# Patient Record
Sex: Female | Born: 1965 | Race: Black or African American | Hispanic: No | Marital: Married | Smoking: Never smoker
Health system: Southern US, Community
[De-identification: ages and names within clinical notes are randomized; demographics above are authoritative.]

## PROBLEM LIST (undated history)

## (undated) HISTORY — PX: REDUCTION MAMMAPLASTY: SUR839

## (undated) HISTORY — PX: ABDOMINAL HYSTERECTOMY: SHX81

---

## 2010-03-13 ENCOUNTER — Ambulatory Visit: Payer: Self-pay | Admitting: Unknown Physician Specialty

## 2010-03-17 ENCOUNTER — Ambulatory Visit: Payer: Self-pay | Admitting: Internal Medicine

## 2010-03-31 ENCOUNTER — Ambulatory Visit: Payer: Self-pay | Admitting: Internal Medicine

## 2010-04-16 ENCOUNTER — Ambulatory Visit: Payer: Self-pay | Admitting: Internal Medicine

## 2010-05-17 ENCOUNTER — Ambulatory Visit: Payer: Self-pay | Admitting: Internal Medicine

## 2010-05-18 ENCOUNTER — Ambulatory Visit: Payer: Self-pay | Admitting: Unknown Physician Specialty

## 2010-06-09 ENCOUNTER — Ambulatory Visit: Payer: Self-pay | Admitting: Unknown Physician Specialty

## 2010-06-16 ENCOUNTER — Ambulatory Visit: Payer: Self-pay | Admitting: Internal Medicine

## 2010-07-17 ENCOUNTER — Ambulatory Visit: Payer: Self-pay | Admitting: Internal Medicine

## 2010-08-17 ENCOUNTER — Ambulatory Visit: Payer: Self-pay | Admitting: Internal Medicine

## 2011-02-06 ENCOUNTER — Emergency Department: Payer: Self-pay | Admitting: Emergency Medicine

## 2011-03-21 ENCOUNTER — Emergency Department: Payer: Self-pay | Admitting: Emergency Medicine

## 2013-07-13 ENCOUNTER — Emergency Department: Payer: Self-pay | Admitting: Emergency Medicine

## 2013-07-13 LAB — COMPREHENSIVE METABOLIC PANEL
Albumin: 3.3 g/dL — ABNORMAL LOW (ref 3.4–5.0)
Alkaline Phosphatase: 91 U/L (ref 50–136)
Anion Gap: 4 — ABNORMAL LOW (ref 7–16)
BUN: 9 mg/dL (ref 7–18)
Chloride: 107 mmol/L (ref 98–107)
Co2: 28 mmol/L (ref 21–32)
Creatinine: 0.96 mg/dL (ref 0.60–1.30)
EGFR (African American): >60
Glucose: 113 mg/dL — ABNORMAL HIGH (ref 65–99)
Osmolality: 277 (ref 275–301)
Potassium: 3.5 mmol/L (ref 3.5–5.1)
SGOT(AST): 17 U/L (ref 15–37)
Sodium: 139 mmol/L (ref 136–145)

## 2013-07-13 LAB — URINALYSIS, COMPLETE
Bilirubin,UR: NEGATIVE
Blood: NEGATIVE
Glucose,UR: NEGATIVE mg/dL (ref 0–75)
Ph: 6 (ref 4.5–8.0)
Protein: NEGATIVE
Specific Gravity: 1.018 (ref 1.003–1.030)

## 2013-07-13 LAB — CBC
HCT: 38.2 % (ref 35.0–47.0)
HGB: 12.5 g/dL (ref 12.0–16.0)
MCHC: 32.7 g/dL (ref 32.0–36.0)
MCV: 83 fL (ref 80–100)
Platelet: 282 10*3/uL (ref 150–440)

## 2013-07-13 LAB — CK TOTAL AND CKMB (NOT AT ARMC)
CK, Total: 85 U/L (ref 21–215)
CK-MB: 0.8 ng/mL (ref 0.5–3.6)

## 2013-07-13 LAB — TROPONIN I: Troponin-I: <0.02 ng/mL

## 2015-05-23 ENCOUNTER — Encounter: Payer: Self-pay | Admitting: Anesthesiology

## 2015-05-23 ENCOUNTER — Ambulatory Visit: Payer: Managed Care, Other (non HMO) | Admitting: Anesthesiology

## 2015-05-23 ENCOUNTER — Ambulatory Visit
Admission: RE | Admit: 2015-05-23 | Discharge: 2015-05-23 | Disposition: A | Discharge status: Home / Self Care | Payer: Managed Care, Other (non HMO) | Source: Ambulatory Visit | Attending: Unknown Physician Specialty | Admitting: Unknown Physician Specialty

## 2015-05-23 ENCOUNTER — Encounter
Admission: RE | Disposition: A | Discharge status: Home / Self Care | Payer: Self-pay | Source: Ambulatory Visit | Attending: Unknown Physician Specialty

## 2015-05-23 DIAGNOSIS — K621 Rectal polyp: Secondary | ICD-10-CM | POA: Diagnosis not present

## 2015-05-23 DIAGNOSIS — K648 Other hemorrhoids: Secondary | ICD-10-CM | POA: Diagnosis not present

## 2015-05-23 DIAGNOSIS — Z1211 Encounter for screening for malignant neoplasm of colon: Secondary | ICD-10-CM | POA: Diagnosis not present

## 2015-05-23 DIAGNOSIS — Z79899 Other long term (current) drug therapy: Secondary | ICD-10-CM | POA: Diagnosis not present

## 2015-05-23 HISTORY — PX: COLONOSCOPY: SHX5424

## 2015-05-23 SURGERY — COLONOSCOPY
Anesthesia: General

## 2015-05-23 MED ORDER — FENTANYL CITRATE (PF) 100 MCG/2ML IJ SOLN
INTRAMUSCULAR | Status: DC | PRN
  Administered 2015-05-23: 50 ug via INTRAVENOUS

## 2015-05-23 MED ORDER — MIDAZOLAM HCL 2 MG/2ML IJ SOLN
INTRAMUSCULAR | Status: DC | PRN
  Administered 2015-05-23: 1 mg via INTRAVENOUS

## 2015-05-23 MED ORDER — SODIUM CHLORIDE 0.9 % IV SOLN: INTRAVENOUS | Status: DC

## 2015-05-23 MED ORDER — LACTATED RINGERS IV SOLN
INTRAVENOUS | Status: DC
  Administered 2015-05-23: 1000 mL via INTRAVENOUS

## 2015-05-23 NOTE — Anesthesia Preprocedure Evaluation (Signed)
Anesthesia Evaluation  Patient identified by MRN, date of birth, ID band Patient awake    Reviewed: Allergy & Precautions, NPO status , Patient's Chart, lab work & pertinent test results  Airway Mallampati: II  TM Distance: >3 FB     Dental  (+) Chipped, Partial Lower   Pulmonary          Cardiovascular     Neuro/Psych    GI/Hepatic   Endo/Other    Renal/GU      Musculoskeletal   Abdominal   Peds  Hematology   Anesthesia Other Findings   Reproductive/Obstetrics                             Anesthesia Physical Anesthesia Plan  ASA: II  Anesthesia Plan: General   Post-op Pain Management:    Induction: Intravenous  Airway Management Planned: Nasal Cannula  Additional Equipment:   Intra-op Plan:   Post-operative Plan:   Informed Consent: I have reviewed the patients History and Physical, chart, labs and discussed the procedure including the risks, benefits and alternatives for the proposed anesthesia with the patient or authorized representative who has indicated his/her understanding and acceptance.     Plan Discussed with:   Anesthesia Plan Comments:         Anesthesia Quick Evaluation

## 2015-05-23 NOTE — Anesthesia Postprocedure Evaluation (Signed)
  Anesthesia Post-op Note  Patient: Jennifer Pace  Procedure(s) Performed: Procedure(s): COLONOSCOPY (N/A)  Anesthesia type:General  Patient location: PACU  Post pain: Pain level controlled  Post assessment: Post-op Vital signs reviewed, Patient's Cardiovascular Status Stable, Respiratory Function Stable, Patent Airway and No signs of Nausea or vomiting  Post vital signs: Reviewed and stable  Last Vitals:  Filed Vitals:   05/23/15 1301  BP: 133/95  Pulse: 78  Temp:   Resp: 12    Level of consciousness: awake, alert  and patient cooperative  Complications: No apparent anesthesia complications

## 2015-05-23 NOTE — Op Note (Signed)
[facility] Gastroenterology Patient Name: Jennifer Pace Procedure Date: 05/23/2015 11:17 AM MRN: [id number] Account #: [id number] Date of Birth: 1966-11-10 Admit Type: Outpatient Age: [age] Room: [facility] ENDO ROOM 1 Gender: Female Note Status: Finalized Procedure:         Colonoscopy Indications:       Screening for colorectal malignant neoplasm Providers:         Scot Junobert T. Elliott, MD Medicines:         Propofol per Anesthesia Complications:     No immediate complications. Procedure:         Pre-Anesthesia Assessment:                    - After reviewing the risks and benefits, the patient was                     deemed in satisfactory condition to undergo the procedure.                    After obtaining informed consent, the colonoscope was                     passed under direct vision. Throughout the procedure, the                     patient's blood pressure, pulse, and oxygen saturations                     were monitored continuously. The Olympus PCF-H180AL                     colonoscope ( S#: [id number] ) was introduced through the                     anus and advanced to the the cecum, identified by                     appendiceal orifice and ileocecal valve. The colonoscopy                     was performed without difficulty. The patient tolerated                     the procedure well. The quality of the bowel preparation                     was excellent. Findings:      A diminutive polyp was found in the rectum. The polyp was sessile. The       polyp was removed with a cold biopsy forceps. Resection and retrieval       were complete.      Internal hemorrhoids were found during endoscopy. The hemorrhoids were       medium-sized and Grade I (internal hemorrhoids that do not prolapse).      The exam was otherwise without abnormality. Impression:        - One diminutive polyp in the rectum. Resected and                     retrieved.                    -  Internal hemorrhoids.                    - The examination was  otherwise normal. Recommendation:    - Await pathology results. Scot Jun, MD 05/23/2015 16:10:96 PM This report has been signed electronically. Number of Addenda: 0 Note Initiated On: 05/23/2015 11:17 AM Scope Withdrawal Time: 0 hours 10 minutes 3 seconds  Total Procedure Duration: 0 hours 17 minutes 57 seconds       [facility]

## 2015-05-23 NOTE — Transfer of Care (Signed)
Immediate Anesthesia Transfer of Care Note  Patient: Jennifer Pace  Procedure(s) Performed: Procedure(s): COLONOSCOPY (N/A)  Patient Location: PACU  Anesthesia Type:General  Level of Consciousness: awake  Airway & Oxygen Therapy: Patient Spontanous Breathing  Post-op Assessment: Report given to RN  Post vital signs: stable  Last Vitals:  Filed Vitals:   05/23/15 1230  BP: 115/53  Pulse: 93  Temp: 36.7 C  Resp: 16    Complications: No apparent anesthesia complications

## 2015-05-23 NOTE — H&P (Signed)
   Primary Care Physician:  PROVIDER NOT IN SYSTEM Primary Gastroenterologist:  Dr. Mechele CollinElliott  Pre-Procedure History & Physical: HPI:  Jennifer Pace is a 49 y.o. female is here for an colonoscopy.   History reviewed. No pertinent past medical history.  History reviewed. No pertinent past surgical history.  Prior to Admission medications   Not on File    Allergies as of 04/19/2015  . (Not on File)    History reviewed. No pertinent family history.  History   Social History  . Marital Status: Married    Spouse Name: N/A  . Number of Children: N/A  . Years of Education: N/A   Occupational History  . Not on file.   Social History Main Topics  . Smoking status: Not on file  . Smokeless tobacco: Not on file  . Alcohol Use: Not on file  . Drug Use: Not on file  . Sexual Activity: Not on file   Other Topics Concern  . Not on file   Social History Narrative  . No narrative on file    Review of Systems: See HPI, otherwise negative ROS  Physical Exam: BP 139/76 mmHg  Pulse 93  Temp(Src) 97.7 F (36.5 C) (Tympanic)  Resp 16  Ht 5\' 7"  (1.702 m)  Wt 94.802 kg (209 lb)  BMI 32.73 kg/m2  SpO2 100% General:   Alert,  pleasant and cooperative in NAD Head:  Normocephalic and atraumatic. Neck:  Supple; no masses or thyromegaly. Lungs:  Clear throughout to auscultation.    Heart:  Regular rate and rhythm. Abdomen:  Soft, nontender and nondistended. Normal bowel sounds, without guarding, and without rebound.   Neurologic:  Alert and  oriented x4;  grossly normal neurologically.  Impression/Plan: Jennifer Pace is here for an colonoscopy to be performed for screening.  Risks, benefits, limitations, and alternatives regarding  colonoscopy have been reviewed with the patient.  Questions have been answered.  All parties agreeable.   Lynnae PrudeELLIOTT, ROBERT, MD  05/23/2015, 12:00 PM

## 2015-05-24 LAB — SURGICAL PATHOLOGY

## 2015-05-26 ENCOUNTER — Encounter: Payer: Self-pay | Admitting: Unknown Physician Specialty

## 2017-08-02 ENCOUNTER — Ambulatory Visit (INDEPENDENT_AMBULATORY_CARE_PROVIDER_SITE_OTHER): Payer: Managed Care, Other (non HMO) | Admitting: Obstetrics & Gynecology

## 2017-08-02 ENCOUNTER — Encounter: Payer: Self-pay | Admitting: Obstetrics & Gynecology

## 2017-08-02 VITALS — BP 120/80 | HR 58 | Ht 67.0 in | Wt 198.0 lb

## 2017-08-02 DIAGNOSIS — Z1211 Encounter for screening for malignant neoplasm of colon: Secondary | ICD-10-CM | POA: Diagnosis not present

## 2017-08-02 DIAGNOSIS — Z0000 Encounter for general adult medical examination without abnormal findings: Secondary | ICD-10-CM | POA: Diagnosis not present

## 2017-08-02 DIAGNOSIS — R232 Flushing: Secondary | ICD-10-CM

## 2017-08-02 NOTE — Progress Notes (Signed)
  HPI:      Jennifer Pace is a 51 y.o. G1P1001 who LMP was in the past, she presents today for her annual examination.  The patient has no complaints today. The patient is sexually active. Herlast pap: approximate date 2016 and was normal and last mammogram: approximate date 2018 and was normal.  The patient does perform self breast exams.  There is no notable family history of breast or ovarian cancer in her family. The patient is not taking hormone replacement therapy. Patient denies post-menopausal vaginal bleeding.   The patient has regular exercise: yes. The patient denies current symptoms of depression.    GYN Hx: Last Colonoscopy:2 years ago. Normal.  Last DEXA: never ago.    PMHx: History reviewed. No pertinent past medical history. Past Surgical History:  Procedure Laterality Date  . ABDOMINAL HYSTERECTOMY    . COLONOSCOPY N/A 05/23/2015   Procedure: COLONOSCOPY;  Surgeon: Scot Jun, MD;  Location: [facility] ENDOSCOPY;  Service: Endoscopy;  Laterality: N/A;   Family History  Problem Relation Age of Onset  . Kidney failure Mother   . Heart Problems Father    Social History  Substance Use Topics  . Smoking status: Never Smoker  . Smokeless tobacco: Never Used  . Alcohol use No    Current Outpatient Prescriptions:  .  Blood Pressure Monitoring (BLOOD PRESSURE MONITOR/L CUFF) MISC, Cuff given and demonstrated use, Disp: , Rfl:  .  Coenzyme Q10 (COQ10) 100 MG CAPS, , Disp: , Rfl:  .  polyethylene glycol powder (GLYCOLAX/MIRALAX) powder, Take as directed for colonoscopy prep., Disp: , Rfl:  .  Multiple Vitamins-Minerals (MULTIVITAMIN WITH MINERALS) tablet, Take by mouth., Disp: , Rfl:  Allergies: Patient has no known allergies.  ROS  Objective: BP 120/80   Pulse (!) 58   Ht 5\' 7"  (1.702 m)   Wt 198 lb (89.8 kg)   BMI 31.01 kg/m   Filed Weights   08/02/17 1429  Weight: 198 lb (89.8 kg)   Body mass index is 31.01 kg/m. OBGyn Exam  Assessment: Annual  Exam 1. Annual physical exam   2. Screen for colon cancer   3. Hot flashes     Plan:            1.  Cervical Screening-  Pap smear schedule reviewed with patient  2. Breast screening- Exam annually and mammogram scheduled  3. Colonoscopy every 10 years, Hemoccult testing after age 69  4. Labs managed by PCP  5. Counseling for hormonal therapy: none Discussed Hot Flashes and their tx options. Patient with bothersome menopausal vasomotor symptoms. Discussed lifestyle interventions such as wearing light clothing, remaining in cool environments, having fan/air conditioner in the room, avoiding hot beverages etc.  Exercise also shown to be significantly helpful in alleviating hot flashes.  Discussed using hormone therapy and concerns about increased risk of heart disease, cerebrovascular disease, thromboembolic disease,  and breast cancer.  Also discussed other medical options such as Clonidine, SSRI, or Neurontin.   Also discussed alternative therapies such as herbal remedies but cautioned that most of the products contained phytoestrogens (plant estrogens) in unregulated amounts which can have the same effects on the body as the pharmaceutical estrogen preparations.  Patient opted for [facility] therapy for now. She will return in as needed for re-evaluation.    F/U  Return in about 1 year (around 08/02/2018) for Annual.  Annamarie Major, MD, [facility], [facility] 08/02/2017  3:05 PM

## 2017-08-02 NOTE — Patient Instructions (Signed)
Perimenopause Perimenopause is the time when your body begins to move into the menopause (no menstrual period for 12 straight months). It is a natural process. Perimenopause can begin 2-8 years before the menopause and usually lasts for 1 year after the menopause. During this time, your ovaries may or may not produce an egg. The ovaries vary in their production of estrogen and progesterone hormones each month. This can cause irregular menstrual periods, difficulty getting pregnant, vaginal bleeding between periods, and uncomfortable symptoms. What are the causes?  Irregular production of the ovarian hormones, estrogen and progesterone, and not ovulating every month. Other causes include:  Tumor of the pituitary gland in the brain.  Medical disease that affects the ovaries.  Radiation treatment.  Chemotherapy.  Unknown causes.  Heavy smoking and excessive alcohol intake can bring on perimenopause sooner.  What are the signs or symptoms?  Hot flashes.  Night sweats.  Irregular menstrual periods.  Decreased sex drive.  Vaginal dryness.  Headaches.  Mood swings.  Depression.  Memory problems.  Irritability.  Tiredness.  Weight gain.  Trouble getting pregnant.  The beginning of losing bone cells (osteoporosis).  The beginning of hardening of the arteries (atherosclerosis). How is this diagnosed? Your health care provider will make a diagnosis by analyzing your age, menstrual history, and symptoms. He or she will do a physical exam and note any changes in your body, especially your female organs. Female hormone tests may or may not be helpful depending on the amount of female hormones you produce and when you produce them. However, other hormone tests may be helpful to rule out other problems. How is this treated? In some cases, no treatment is needed. The decision on whether treatment is necessary during the perimenopause should be made by you and your health care  provider based on how the symptoms are affecting you and your lifestyle. Various treatments are available, such as:  Treating individual symptoms with a specific medicine for that symptom.  Herbal medicines that can help specific symptoms.  Counseling.  Group therapy.  Follow these instructions at home:  Keep track of your menstrual periods (when they occur, how heavy they are, how long between periods, and how long they last) as well as your symptoms and when they started.  Only take over-the-counter or prescription medicines as directed by your health care provider.  Sleep and rest.  Exercise.  Eat a diet that contains calcium (good for your bones) and soy (acts like the estrogen hormone).  Do not smoke.  Avoid alcoholic beverages.  Take vitamin supplements as recommended by your health care provider. Taking vitamin E may help in certain cases.  Take calcium and vitamin D supplements to help prevent bone loss.  Group therapy is sometimes helpful.  Acupuncture may help in some cases. Contact a health care provider if:  You have questions about any symptoms you are having.  You need a referral to a specialist (gynecologist, psychiatrist, or psychologist). Get help right away if:  You have vaginal bleeding.  Your period lasts longer than 8 days.  Your periods are recurring sooner than 21 days.  You have bleeding after intercourse.  You have severe depression.  You have pain when you urinate.  You have severe headaches.  You have vision problems. This information is not intended to replace advice given to you by your health care provider. Make sure you discuss any questions you have with your health care provider. Document Released: 01/10/2005 Document Revised: 05/10/2016 Document Reviewed: 07/02/2013  Risk analyst Patient Education  2017 [employer].   Black Cohosh, Cimicifuga racemosa oral dosage forms What is this medicine? BLACK COHOSH (blak  KOH hosh) or Cimicifuga racemosa is a dietary supplement. It is promoted to relieve symptoms of menopause, such as hot flashes. The FDA has not approved this supplement for any medical use. This supplement may be used for other purposes; ask your health care provider or pharmacist if you have questions. This medicine may be used for other purposes; ask your health care provider or pharmacist if you have questions. What should I tell my health care provider before I take this medicine? They need to know if you have any of these conditions: -breast cancer -cervical, ovarian or uterine cancer -high blood pressure -infertility -liver disease -menstrual changes or irregular periods -unusual vaginal or uterine bleeding -an unusual or allergic reaction to black cohosh, soybeans, tartrazine dye (yellow dye number 5), other medicines, foods, dyes, or preservatives -pregnant or trying to get pregnant -breast-feeding How should I use this medicine? Take this herb by mouth with a glass of water. Follow the directions on the package labeling, or talk to your health care professional. Do not use for longer than 6 months without the advice of a health care professional. Do not use if you are pregnant or breast-feeding. Talk to your obstetrician-gynecologist or certified nurse-midwife. This herb is not for use in children under the age of 18 years. Overdosage: If you think you have taken too much of this medicine contact a poison control center or emergency room at once. NOTE: This medicine is only for you. Do not share this medicine with others. What if I miss a dose? If you miss a dose, take it as soon as you can. If it is almost time for your next dose, take only that dose. Do not take double or extra doses. What may interact with this medicine? -atorvastatin -cisplatin -fertility treatments This list may not describe all possible interactions. Give your health care provider a list of all the medicines,  herbs, non-prescription drugs, or dietary supplements you use. Also tell them if you smoke, drink alcohol, or use illegal drugs. Some items may interact with your medicine. What should I watch for while using this medicine? Since this herb is derived from a plant, allergic reactions are possible. Stop using this herb if you develop a rash. You may need to see your health care professional, or inform them that this occurred. Report any unusual side effects promptly. If you are taking this herb for menstrual or menopausal symptoms, visit your doctor or health care professional for regular checks on your progress. You should have a complete check-up every 6 months. You will need a regular breast and pelvic exam while on this therapy. Follow the advice of your doctor or health care professional. Women should inform their doctor if they wish to become pregnant or think they might be pregnant. If you have any reason to think you are pregnant, stop taking this herb at once and contact your doctor or health care professional. Herbal or dietary supplements are not regulated like medicines. Rigid quality control standards are not required for dietary supplements. The purity and strength of these products can vary. The safety and effect of this dietary supplement for a certain disease or illness is not well known. This product is not intended to diagnose, treat, cure or prevent any disease. The Food and Drug Administration suggests the following to help consumers protect themselves: -Always read product  labels and follow directions. -Natural does not mean a product is safe for humans to take. -Look for products that include USP after the ingredient name. This means that the manufacturer followed the standards of the [place] Pharmacopoeia. -Supplements made or sold by a nationally known food or drug company are more likely to be made under tight controls. You can write to the company for more information about how the  product was made. What side effects may I notice from receiving this medicine? Side effects that you should report to your doctor or health care professional as soon as possible: -allergic reactions like skin rash, itching or hives, swelling of the face, lips, or tongue -breathing problems -dizziness -palpitations -signs and symptoms of liver injury like dark yellow or brown urine; general ill feeling or flu-like symptoms; light-colored stools; loss of appetite; nausea; right upper belly pain; unusually weak or tired; yellowing of the eyes or skin -unusual vaginal bleeding Side effects that usually do not require medical attention (report to your doctor or health care professional if they continue or are bothersome): -breast tenderness -headache -nausea -upset stomach This list may not describe all possible side effects. Call your doctor for medical advice about side effects. You may report side effects to FDA at 1-800-FDA-1088. Where should I keep my medicine? Keep out of the reach of children. Store at room temperature between 15 and 30 degrees C (59 and 86 degrees C). Throw away any unused herb after the expiration date. NOTE: This sheet is a summary. It may not cover all possible information. If you have questions about this medicine, talk to your doctor, pharmacist, or health care provider.  2018 Elsevier/Gold Standard (2016-06-13 14:35:09)

## 2018-03-21 ENCOUNTER — Ambulatory Visit (INDEPENDENT_AMBULATORY_CARE_PROVIDER_SITE_OTHER): Payer: Managed Care, Other (non HMO) | Admitting: Obstetrics & Gynecology

## 2018-03-21 ENCOUNTER — Encounter: Payer: Self-pay | Admitting: Obstetrics & Gynecology

## 2018-03-21 VITALS — BP 120/80 | HR 79 | Ht 67.0 in | Wt 191.0 lb

## 2018-03-21 DIAGNOSIS — N951 Menopausal and female climacteric states: Secondary | ICD-10-CM | POA: Insufficient documentation

## 2018-03-21 MED ORDER — ESTRADIOL 0.05 MG/24HR TD PTWK: 0.0500 mg | MEDICATED_PATCH | TRANSDERMAL | 11 refills | Status: DC

## 2018-03-21 NOTE — Patient Instructions (Signed)
Estradiol skin patches What is this medicine? ESTRADIOL (es tra DYE ole) skin patches contain an estrogen. It is mostly used as hormone replacement in menopausal women. It helps to treat hot flashes and prevent osteoporosis. It is also used to treat women with low estrogen levels or those who have had their ovaries removed. This medicine may be used for other purposes; ask your health care provider or pharmacist if you have questions. COMMON BRAND NAME(S): Alora, Climara, Esclim, Estraderm, FemPatch, Menostar, Minivelle, Vivelle, Vivelle-Dot What should I tell my health care provider before I take this medicine? They need to know if you have any of these conditions: -abnormal vaginal bleeding -blood vessel disease or blood clots -breast, cervical, endometrial, ovarian, liver, or uterine cancer -dementia -diabetes -gallbladder disease -heart disease or recent heart attack -high blood pressure -high cholesterol -high level of calcium in the blood -hysterectomy -kidney disease -liver disease -migraine headaches -protein C deficiency -protein S deficiency -stroke -systemic lupus erythematosus (SLE) -tobacco smoker -an unusual or allergic reaction to estrogens, other hormones, medicines, foods, dyes, or preservatives -pregnant or trying to get pregnant -breast-feeding How should I use this medicine? This medicine is for external use only. Follow the directions on the prescription label. Tear open the pouch, do not use scissors. Remove the stiff protective liner covering the adhesive. Try not to touch the adhesive. Apply the patch, sticky side to the skin, to an area that is clean, dry and hairless. Avoid injured, irritated, calloused, or scarred areas. Do not apply the skin patches to your breasts or around the waistline. Use a different site each time to prevent skin irritation. Do not cut or trim the patch. Do not stop using except on the advice of your doctor or health care professional.  Do not wear more than one patch at a time unless you are told to do so by your doctor or health care professional. Contact your pediatrician regarding the use of this medicine in children. Special care may be needed. A patient package insert for the product will be given with each prescription and refill. Read this sheet carefully each time. The sheet may change frequently. Overdosage: If you think you have taken too much of this medicine contact a poison control center or emergency room at once. NOTE: This medicine is only for you. Do not share this medicine with others. What if I miss a dose? If you miss a dose, apply it as soon as you can. If it is almost time for your next dose, apply only that dose. Do not apply double or extra doses. What may interact with this medicine? Do not take this medicine with any of the following medications: -aromatase inhibitors like aminoglutethimide, anastrozole, exemestane, letrozole, testolactone This medicine may also interact with the following medications: -carbamazepine -certain antibiotics used to treat infections -certain barbiturates used for inducing sleep or treating seizures -grapefruit juice -medicines for fungus infections like itraconazole and ketoconazole -raloxifene or tamoxifen -rifabutin, rifampin, or rifapentine -ritonavir -St. John's Wort This list may not describe all possible interactions. Give your health care provider a list of all the medicines, herbs, non-prescription drugs, or dietary supplements you use. Also tell them if you smoke, drink alcohol, or use illegal drugs. Some items may interact with your medicine. What should I watch for while using this medicine? Visit your doctor or health care professional for regular checks on your progress. You will need a regular breast and pelvic exam and Pap smear while on this medicine. You should also   discuss the need for regular mammograms with your health care professional, and follow  his or her guidelines for these tests. This medicine can make your body retain fluid, making your fingers, hands, or ankles swell. Your blood pressure can go up. Contact your doctor or health care professional if you feel you are retaining fluid. If you have any reason to think you are pregnant, stop taking this medicine right away and contact your doctor or health care professional. Smoking increases the risk of getting a blood clot or having a stroke while you are taking this medicine, especially if you are more than 54 years old. You are strongly advised not to smoke. If you wear contact lenses and notice visual changes, or if the lenses begin to feel uncomfortable, consult your eye doctor or health care professional. This medicine can increase the risk of developing a condition (endometrial hyperplasia) that may lead to cancer of the lining of the uterus. Taking progestins, another hormone drug, with this medicine lowers the risk of developing this condition. Therefore, if your uterus has not been removed (by a hysterectomy), your doctor may prescribe a progestin for you to take together with your estrogen. You should know, however, that taking estrogens with progestins may have additional health risks. You should discuss the use of estrogens and progestins with your health care professional to determine the benefits and risks for you. If you are going to have surgery or an MRI, you may need to stop taking this medicine. Consult your health care professional for advice before you schedule the surgery. Contact with water while you are swimming, using a sauna, bathing, or showering may cause the patch to fall off. If your patch falls off reapply it. If you cannot reapply the patch, apply a new patch to another area and continue to follow your usual dose schedule. What side effects may I notice from receiving this medicine? Side effects that you should report to your doctor or health care professional as  soon as possible: -allergic reactions like skin rash, itching or hives, swelling of the face, lips, or tongue -breast tissue changes or discharge -changes in vision -chest pain -confusion, trouble speaking or understanding -dark urine -general ill feeling or flu-like symptoms -light-colored stools -nausea, vomiting -pain, swelling, warmth in the leg -right upper belly pain -severe headaches -shortness of breath -sudden numbness or weakness of the face, arm or leg -trouble walking, dizziness, loss of balance or coordination -unusual vaginal bleeding -yellowing of the eyes or skin Side effects that usually do not require medical attention (report to your doctor or health care professional if they continue or are bothersome): -hair loss -increased hunger or thirst -increased urination -symptoms of vaginal infection like itching, irritation or unusual discharge -unusually weak or tired This list may not describe all possible side effects. Call your doctor for medical advice about side effects. You may report side effects to FDA at 1-800-FDA-1088. Where should I keep my medicine? Keep out of the reach of children. Store at room temperature below 30 degrees C (86 degrees F). Do not store any patches that have been removed from their protective pouch. Throw away any unused medicine after the expiration date. Dispose of used patches properly. Since used patches may still contain active hormones, fold the patch in half so that it sticks to itself prior to disposal. NOTE: This sheet is a summary. It may not cover all possible information. If you have questions about this medicine, talk to your doctor, pharmacist, or   health care provider.  2018 Elsevier/Gold Standard (2016-06-26 12:58:11)  

## 2018-03-21 NOTE — Progress Notes (Signed)
HPI:      [name] is a 52 y.o. G1P1001 who is postmenopausal, presents today for a problem visit.  She complains of hot flashes, sweats.   Symptoms have been present for 1 years. Symptoms are mod to severe and has led her to come in today to seek options for intervention.  Previous Treatment: Exercise.  [place] (worked for a few mos then quit working)  Denies PostMenopausal Bleeding  PMHx: She  has no past medical history on file. Also,  has a past surgical history that includes Colonoscopy (N/A, 05/23/2015) and Abdominal hysterectomy., family history includes Heart Problems in her father; Kidney failure in her mother.,  reports that she has never smoked. She has never used smokeless tobacco. She reports that she does not drink alcohol or use drugs.  She has a current medication list which includes the following prescription(s): blood pressure monitor/l cuff, coq10, estradiol, multivitamin with minerals, and polyethylene glycol powder. Also, has No Known Allergies.  Review of Systems  Constitutional: Negative for chills, fever and malaise/fatigue.  HENT: Negative for congestion, sinus pain and sore throat.   Eyes: Negative for blurred vision and pain.  Respiratory: Negative for cough and wheezing.   Cardiovascular: Negative for chest pain and leg swelling.  Gastrointestinal: Negative for abdominal pain, constipation, diarrhea, heartburn, nausea and vomiting.  Genitourinary: Negative for dysuria, frequency, hematuria and urgency.  Musculoskeletal: Negative for back pain, joint pain, myalgias and neck pain.  Skin: Negative for itching and rash.  Neurological: Negative for dizziness, tremors and weakness.  Endo/Heme/Allergies: Does not bruise/bleed easily.  Psychiatric/Behavioral: Negative for depression. The patient is not nervous/anxious and does not have insomnia.    Objective: BP 120/80   Pulse 79   Ht 5\' 7"  (1.702 m)   Wt 191 lb (86.6 kg)   BMI 29.91 kg/m    Physical Exam  Constitutional: She is oriented to person, place, and time. She appears well-developed and well-nourished. No distress.  Musculoskeletal: Normal range of motion.  Neurological: She is alert and oriented to person, place, and time.  Skin: Skin is warm and dry.  Psychiatric: She has a normal mood and affect.  Vitals reviewed.  ASSESSMENT/PLAN:  Menopause. 1. Hot flash, menopausal ERT vs non-hormonal treatment options discussed.  Prefers hormonal patch.  I have discussed HRT with the patient in detail.  The risk/benefits of it were reviewed.  She understands that during menopause Estrogen decreases dramatically and that this results in an increased risk of cardiovascular disease as well as osteoporosis.  We have also discussed the fact that hot flashes often result from a decrease in Estrogen, and that by replacing Estrogen, they can often be alleviated.  We have discussed skin, vaginal and urinary tract changes that may also take place from this drop in Estrogen.  Emotional changes have also been linked to Estrogen and we have briefly discussed this.  The benefits of HRT including decrease in hot flashes, vaginal dryness, and osteoporosis were discussed.  The emotional benefit and a possible change in her cardiovascular risk profile was also reviewed.  The risks associated with Hormone Replacement Therapy were also reviewed.  The use of unopposed Estrogen and its relationship to endometrial cancer was discussed.  The addition of Progesterone and its beneficial effect on endometrial cancer was also noted.  The fact that there has been no consistent definitive studies showing an increase in breast cancer in women who use HRT was discussed with the patient.  The possible side effects including  breast tenderness, fluid retention, mood changes and vaginal bleeding were discussed.  The patient was informed that this is an elective medication and that she may choose not to take Hormone  Replacement Therapy.  Literature on HRT was given, and I believe that after answering all of the patient's questions, she has an adequate and informed understanding of HRT.  Special emphasis on the WHI study, as well as several studies since that pertaining to the risks and benefits of estrogen replacement therapy were compared.  The possible limitations of these studies were discussed including the age stratification of the WHI study.  The possible role of Progesterone in these studies was discussed in detail.  I believe that the patient has an informed knowledge of the risks and benefits of HRT.  I have specifically discussed WHI findings and current updates.  Different type of hormone formulation and methods of taking hormone replacement therapy discussed.   Climara patch 0.5 mg Rx  A total of 15 minutes were spent face-to-face with the patient during this encounter and over half of that time dealt with counseling and coordination of care.  Annamarie MajorPaul Kaija Kovacevic, MD, Merlinda FrederickFACOG [facility], [facility] 03/21/2018  2:22 PM

## 2018-04-07 ENCOUNTER — Telehealth: Payer: Self-pay

## 2018-04-07 NOTE — Telephone Encounter (Signed)
Pt wants to change her estradiol  patch to the pill. States she is having a hard time keeping the patch from peeling off. Pt is aware you are not in the office this week.

## 2018-04-11 MED ORDER — ESTRADIOL 1 MG PO TABS: 1.0000 mg | ORAL_TABLET | Freq: Every day | ORAL | 11 refills | Status: DC

## 2018-04-11 NOTE — Telephone Encounter (Signed)
ERx done for this change

## 2018-04-17 ENCOUNTER — Other Ambulatory Visit: Payer: Self-pay | Admitting: Obstetrics & Gynecology

## 2018-04-17 DIAGNOSIS — R928 Other abnormal and inconclusive findings on diagnostic imaging of breast: Secondary | ICD-10-CM

## 2018-04-24 ENCOUNTER — Encounter: Payer: Self-pay | Admitting: Obstetrics & Gynecology

## 2018-08-04 ENCOUNTER — Encounter: Payer: Self-pay | Admitting: Obstetrics & Gynecology

## 2018-08-04 ENCOUNTER — Ambulatory Visit (INDEPENDENT_AMBULATORY_CARE_PROVIDER_SITE_OTHER): Payer: Managed Care, Other (non HMO) | Admitting: Obstetrics & Gynecology

## 2018-08-04 VITALS — BP 140/80 | Ht 67.0 in | Wt 198.0 lb

## 2018-08-04 DIAGNOSIS — Z01411 Encounter for gynecological examination (general) (routine) with abnormal findings: Secondary | ICD-10-CM

## 2018-08-04 DIAGNOSIS — Z1211 Encounter for screening for malignant neoplasm of colon: Secondary | ICD-10-CM | POA: Diagnosis not present

## 2018-08-04 DIAGNOSIS — R232 Flushing: Secondary | ICD-10-CM

## 2018-08-04 DIAGNOSIS — Z0000 Encounter for general adult medical examination without abnormal findings: Secondary | ICD-10-CM

## 2018-08-04 MED ORDER — ESTRADIOL 1 MG PO TABS: 1.0000 mg | ORAL_TABLET | Freq: Every day | ORAL | 3 refills | Status: DC

## 2018-08-04 NOTE — Patient Instructions (Signed)
PAP every 5 years Mammogram every year    Call [phone] [facility] Colonoscopy every 10 years Labs yearly (with PCP)

## 2018-08-04 NOTE — Progress Notes (Signed)
HPI:      [name] is a 52 y.o. G1P1001 who LMP was in the past, she presents today for her annual examination.  The patient has no complaints today. The patient is sexually active. Herlast pap: approximate date 2016 (vag PAP, prior hysterectomy) and was normal and last mammogram: approximate date 2019 (MAY) and was abnormal: subsequesnt needle breast biopsy normal.  The patient does perform self breast exams.  There is no notable family history of breast or ovarian cancer in her family. The patient is taking hormone replacement therapy.  She was started recently on ERT pill for vasomotor sx's; reports significant improvement. Patch did not stay on well. Patient denies post-menopausal vaginal bleeding.   The patient has regular exercise: yes. The patient denies current symptoms of depression.    GYN Hx: Last Colonoscopy:3 years ago. Normal.  Last DEXA: never ago.    PMHx: History reviewed. No pertinent past medical history. Past Surgical History:  Procedure Laterality Date  . ABDOMINAL HYSTERECTOMY    . COLONOSCOPY N/A 05/23/2015   Procedure: COLONOSCOPY;  Surgeon: Scot Junobert T Elliott, MD;  [facility];  Service: Endoscopy;  Laterality: N/A;   Family History  Problem Relation Age of Onset  . Kidney failure Mother   . Heart Problems Father    Social History   Tobacco Use  . Smoking status: Never Smoker  . Smokeless tobacco: Never Used  Substance Use Topics  . Alcohol use: No  . Drug use: No    Current Outpatient Medications:  .  Blood Pressure Monitoring (BLOOD PRESSURE MONITOR/L CUFF) MISC, Cuff given and demonstrated use, Disp: , Rfl:  .  Coenzyme Q10 (COQ10) 100 MG CAPS, , Disp: , Rfl:  .  estradiol (ESTRACE) 1 MG tablet, Take 1 tablet (1 mg total) by mouth daily., Disp: 30 tablet, Rfl: 11 .  Multiple Vitamins-Minerals (MULTIVITAMIN WITH MINERALS) tablet, Take by mouth., Disp: , Rfl:  .  polyethylene glycol powder (GLYCOLAX/MIRALAX) powder, Take as  directed for colonoscopy prep., Disp: , Rfl:  Allergies: Patient has no known allergies.  Review of Systems  Constitutional: Negative for chills, fever and malaise/fatigue.  HENT: Negative for congestion, sinus pain and sore throat.   Eyes: Negative for blurred vision and pain.  Respiratory: Negative for cough and wheezing.   Cardiovascular: Negative for chest pain and leg swelling.  Gastrointestinal: Negative for abdominal pain, constipation, diarrhea, heartburn, nausea and vomiting.  Genitourinary: Negative for dysuria, frequency, hematuria and urgency.  Musculoskeletal: Negative for back pain, joint pain, myalgias and neck pain.  Skin: Negative for itching and rash.  Neurological: Negative for dizziness, tremors and weakness.  Endo/Heme/Allergies: Does not bruise/bleed easily.  Psychiatric/Behavioral: Negative for depression. The patient is not nervous/anxious and does not have insomnia.     Objective: BP 140/80   Ht 5\' 7"  (1.702 m)   Wt 198 lb (89.8 kg)   BMI 31.01 kg/m   Filed Weights   08/04/18 1512  Weight: 198 lb (89.8 kg)   Body mass index is 31.01 kg/m. Physical Exam  Constitutional: She is oriented to person, place, and time. She appears well-developed and well-nourished. No distress.  Genitourinary: Rectum normal and vagina normal. Pelvic exam was performed with patient supine. There is no rash or lesion on the right labia. There is no rash or lesion on the left labia. Vagina exhibits no lesion. No bleeding in the vagina. Right adnexum does not display mass and does not display tenderness. Left adnexum does not display mass and  does not display tenderness.  Genitourinary Comments: Absent Uterus Absent cervix Vaginal cuff well healed  HENT:  Head: Normocephalic and atraumatic. Head is without laceration.  Right Ear: Hearing normal.  Left Ear: Hearing normal.  Nose: No epistaxis.  No foreign bodies.  Mouth/Throat: Uvula is midline, oropharynx is clear and moist and  mucous membranes are normal.  Eyes: Pupils are equal, round, and reactive to light.  Neck: Normal range of motion. Neck supple. No thyromegaly present.  Cardiovascular: Normal rate and regular rhythm. Exam reveals no gallop and no friction rub.  No murmur heard. Pulmonary/Chest: Effort normal and breath sounds normal. No respiratory distress. She has no wheezes. Right breast exhibits no mass, no skin change and no tenderness. Left breast exhibits no mass, no skin change and no tenderness.  Abdominal: Soft. Bowel sounds are normal. She exhibits no distension. There is no tenderness. There is no rebound.  Musculoskeletal: Normal range of motion.  Neurological: She is alert and oriented to person, place, and time. No cranial nerve deficit.  Skin: Skin is warm and dry.  Psychiatric: She has a normal mood and affect. Judgment normal.  Vitals reviewed.  Assessment: Annual Exam 1. Annual physical exam   2. Screen for colon cancer    Plan:            1.  Cervical Screening-  Pap smear schedule reviewed with patient  2. Breast screening- Exam annually and mammogram scheduled  3. Colonoscopy every 10 years, Hemoccult testing after age 66, cards gv  4. Labs managed by 43PCP  5. Counseling for hormonal therapy: no change in therapy today [facility] as has improved vasomotor sx's  6. Flu shot planned at work for free     F/U  Return in about 1 year (around 08/05/2019) for Annual.  Annamarie MajorPaul Jumar Greenstreet, MD, Merlinda FrederickFACOG [facility], [facility] 08/04/2018  3:35 PM

## 2019-05-25 ENCOUNTER — Other Ambulatory Visit: Payer: Self-pay | Admitting: Obstetrics & Gynecology

## 2020-09-30 ENCOUNTER — Emergency Department: Payer: Managed Care, Other (non HMO)

## 2020-09-30 ENCOUNTER — Emergency Department
Admission: EM | Admit: 2020-09-30 | Discharge: 2020-09-30 | Disposition: A | Discharge status: Home / Self Care | Payer: Managed Care, Other (non HMO) | Attending: Emergency Medicine | Admitting: Emergency Medicine

## 2020-09-30 ENCOUNTER — Other Ambulatory Visit: Payer: Self-pay

## 2020-09-30 DIAGNOSIS — R079 Chest pain, unspecified: Secondary | ICD-10-CM | POA: Insufficient documentation

## 2020-09-30 DIAGNOSIS — Z5321 Procedure and treatment not carried out due to patient leaving prior to being seen by health care provider: Secondary | ICD-10-CM | POA: Insufficient documentation

## 2020-09-30 DIAGNOSIS — R0602 Shortness of breath: Secondary | ICD-10-CM | POA: Insufficient documentation

## 2020-09-30 LAB — CBC
HCT: 38.8 % (ref 36.0–46.0)
Hemoglobin: 12.3 g/dL (ref 12.0–15.0)
MCH: 25.9 pg — ABNORMAL LOW (ref 26.0–34.0)
MCHC: 31.7 g/dL (ref 30.0–36.0)
MCV: 81.7 fL (ref 80.0–100.0)
Platelets: 280 10*3/uL (ref 150–400)
RBC: 4.75 MIL/uL (ref 3.87–5.11)
RDW: 14.4 % (ref 11.5–15.5)
WBC: 8.1 10*3/uL (ref 4.0–10.5)
nRBC: 0 % (ref 0.0–0.2)

## 2020-09-30 LAB — COMPREHENSIVE METABOLIC PANEL
ALT: 12 U/L (ref 0–44)
AST: 19 U/L (ref 15–41)
Albumin: 3.8 g/dL (ref 3.5–5.0)
Alkaline Phosphatase: 79 U/L (ref 38–126)
Anion gap: 7 (ref 5–15)
BUN: 16 mg/dL (ref 6–20)
CO2: 28 mmol/L (ref 22–32)
Calcium: 9.2 mg/dL (ref 8.9–10.3)
Chloride: 103 mmol/L (ref 98–111)
Creatinine, Ser: 0.85 mg/dL (ref 0.44–1.00)
GFR, Estimated: >60 mL/min (ref >60)
Glucose, Bld: 107 mg/dL — ABNORMAL HIGH (ref 70–99)
Potassium: 3.9 mmol/L (ref 3.5–5.1)
Sodium: 138 mmol/L (ref 135–145)
Total Bilirubin: 0.7 mg/dL (ref 0.3–1.2)
Total Protein: 7.9 g/dL (ref 6.5–8.1)

## 2020-09-30 LAB — TROPONIN I (HIGH SENSITIVITY): Troponin I (High Sensitivity): 4 ng/L (ref <18)

## 2020-09-30 NOTE — ED Triage Notes (Signed)
Pt states she woke up this am with co left sided chest pain radiating to left arm, chills, and felt diaphoretic. States did have some shob, denies any hx of heart disease.

## 2020-10-03 ENCOUNTER — Telehealth: Payer: Self-pay | Admitting: Emergency Medicine

## 2020-10-03 NOTE — Telephone Encounter (Signed)
Called patient due to lwot to inquire about condition and follow up plans. Left message.   

## 2021-03-15 ENCOUNTER — Other Ambulatory Visit (HOSPITAL_COMMUNITY)
Admission: RE | Admit: 2021-03-15 | Discharge: 2021-03-15 | Disposition: A | Discharge status: Home / Self Care | Payer: Managed Care, Other (non HMO) | Source: Ambulatory Visit | Attending: Obstetrics | Admitting: Obstetrics

## 2021-03-15 ENCOUNTER — Ambulatory Visit (INDEPENDENT_AMBULATORY_CARE_PROVIDER_SITE_OTHER): Payer: Managed Care, Other (non HMO) | Admitting: Obstetrics

## 2021-03-15 ENCOUNTER — Other Ambulatory Visit: Payer: Self-pay

## 2021-03-15 ENCOUNTER — Encounter: Payer: Self-pay | Admitting: Obstetrics

## 2021-03-15 VITALS — BP 142/78 | Ht 67.0 in | Wt 187.0 lb

## 2021-03-15 DIAGNOSIS — N951 Menopausal and female climacteric states: Secondary | ICD-10-CM | POA: Diagnosis not present

## 2021-03-15 DIAGNOSIS — Z01419 Encounter for gynecological examination (general) (routine) without abnormal findings: Secondary | ICD-10-CM

## 2021-03-15 DIAGNOSIS — Z0000 Encounter for general adult medical examination without abnormal findings: Secondary | ICD-10-CM | POA: Diagnosis not present

## 2021-03-15 DIAGNOSIS — Z124 Encounter for screening for malignant neoplasm of cervix: Secondary | ICD-10-CM

## 2021-03-15 MED ORDER — ESTRADIOL 1 MG PO TABS: 1.0000 mg | ORAL_TABLET | Freq: Every day | ORAL | 3 refills | Status: DC

## 2021-03-15 NOTE — Progress Notes (Signed)
Gynecology Annual Exam  PCP: System, Provider Not In  Chief Complaint:  Chief Complaint  Patient presents with  . Annual Exam    History of Present Illness:Patient is a 55 y.o. G1P1001 presents for annual exam.  Her last GYN exam was three years ago. The patient has no complaints today. She would like to restart her Estrace that she used to take for hot flashes/manopausal symptoms  LMP: No LMP recorded. Patient has had a hysterectomy. Menarche:11  The patient is sexually active. She denies dyspareunia.  The patient does perform self breast exams.  There is no notable family history of breast or ovarian cancer in her family.  The patient wears seatbelts: yes.   The patient has regular exercise: yes.    The patient denies current symptoms of depression.     Review of Systems: Review of Systems  Constitutional: Negative.   HENT: Negative.   Eyes: Negative.   Respiratory: Negative.   Cardiovascular: Negative.   Gastrointestinal: Negative.   Genitourinary: Negative.   Musculoskeletal: Negative.   Skin: Negative.   Neurological: Negative.   Psychiatric/Behavioral: Negative.     Past Medical History:  Patient Active Problem List   Diagnosis Date Noted  . Hot flash, menopausal 03/21/2018  . Hot flashes 08/02/2017    Past Surgical History:  Past Surgical History:  Procedure Laterality Date  . ABDOMINAL HYSTERECTOMY    . COLONOSCOPY N/A 05/23/2015   Procedure: COLONOSCOPY;  Surgeon: Scot Jun, MD;  Location: [facility] ENDOSCOPY;  Service: Endoscopy;  Laterality: N/A;    Gynecologic History:  No LMP recorded. Patient has had a hysterectomy. Last Pap: Results were: 2019 no abnormalities  Last mammogram: 2021 Results were: BI-RAD I  Obstetric History: G1P1001  Family History:  Family History  Problem Relation Age of Onset  . Kidney failure Mother   . Heart Problems Father     Social History:  Social History   Socioeconomic History  . Marital status:  Married    Spouse name: Not on file  . Number of children: Not on file  . Years of education: Not on file  . Highest education level: Not on file  Occupational History  . Not on file  Tobacco Use  . Smoking status: Never Smoker  . Smokeless tobacco: Never Used  Vaping Use  . Vaping Use: Never used  Substance and Sexual Activity  . Alcohol use: No  . Drug use: No  . Sexual activity: Yes    Birth control/protection: Surgical  Other Topics Concern  . Not on file  Social History Narrative  . Not on file   Social Determinants of Health   Financial Resource Strain: Not on file  Food Insecurity: Not on file  Transportation Needs: Not on file  Physical Activity: Not on file  Stress: Not on file  Social Connections: Not on file  Intimate Partner Violence: Not on file    Allergies:  No Known Allergies  Medications: Prior to Admission medications   Medication Sig Start Date End Date Taking? Authorizing Provider  estradiol (ESTRACE) 1 MG tablet Take 1 tablet (1 mg total) by mouth daily. 03/15/21 03/15/22  Mirna Mires, CNM  Multiple Vitamins-Minerals (MULTIVITAMIN WITH MINERALS) tablet Take by mouth.    [provider]    Physical Exam Vitals: Blood pressure (!) 142/78, height 5\' 7"  (1.702 m), weight 187 lb (84.8 kg).  General: NAD HEENT: normocephalic, anicteric Thyroid: no enlargement, no palpable nodules Pulmonary: No increased work of breathing,  CTAB Cardiovascular: RRR, distal pulses 2+ Breast: Breast symmetrical, no tenderness, no palpable nodules or masses, no skin or nipple retraction present, no nipple discharge.  No axillary or supraclavicular lymphadenopathy. Abdomen: NABS, soft, non-tender, non-distended.  Umbilicus without lesions.  No hepatomegaly, splenomegaly or masses palpable. No evidence of hernia  Genitourinary:  External: Normal external female genitalia.  Normal urethral meatus, normal Bartholin's and Skene's glands.    Vagina: Normal  vaginal mucosa, no evidence of prolapse.    Cervix: Grossly normal in appearance, no bleeding  Uterus: Non-enlarged, mobile, normal contour.  No CMT  Adnexa: ovaries non-enlarged, no adnexal masses  Rectal: deferred  Lymphatic: no evidence of inguinal lymphadenopathy Extremities: no edema, erythema, or tenderness Neurologic: Grossly intact Psychiatric: mood appropriate, affect full  Female chaperone present for pelvic and breast  portions of the physical exam     Assessment: 55 y.o. G1P1001 routine annual exam  Plan: Problem List Items Addressed This Visit   None   Visit Diagnoses    Women's annual routine gynecological examination    -  Primary   Relevant Medications   estradiol (ESTRACE) 1 MG tablet   Other Relevant Orders   Cytology - PAP   Comprehensive metabolic panel   TSH   Cervical cancer screening       Relevant Orders   Cytology - PAP   Hot flashes due to menopause       Relevant Medications   estradiol (ESTRACE) 1 MG tablet   Other Relevant Orders   Lipid Panel With LDL/HDL Ratio   TSH   Healthcare maintenance       Relevant Orders   Comprehensive metabolic panel   Health maintenance examination       Relevant Orders   Lipid Panel With LDL/HDL Ratio   TSH      1) Mammogram - recommend yearly screening mammogram.  Mammogram Is up to date  2) STI screening  wasoffered and declined  3) ASCCP guidelines and rational discussed.  Patient opts for every 3 years screening interval  4) Osteoporosis  - per USPTF routine screening DEXA at age 71- managed by her PCP   5) Routine healthcare maintenance including cholesterol, diabetes screening discussed To return fasting at a later date. Lipid profile, TSH and CMP ordered.  6) Colonoscopy is up to date..Her PCP manages this.  Screening recommended starting at age 19 for average risk individuals, age 1 for individuals deemed at increased risk (including African Americans) and recommended to continue until age  81.  For patient age 62-85 individualized approach is recommended.  Gold standard screening is via colonoscopy, Cologuard screening is an acceptable alternative for patient unwilling or unable to undergo colonoscopy.  "Colorectal cancer screening for average?risk adults: 2018 guideline update from the American Cancer Society"CA: A Cancer Journal for Clinicians: May 15, 2017   7) Return in about 4 weeks (around 04/12/2021) for needs appointment  with [facility] for?polyp removal and fasting labs.    Mirna Mires, CNM  03/15/2021 5:36 PM   Domingo Pulse, [facility] 03/15/2021, 5:36 PM

## 2021-03-17 LAB — CYTOLOGY - PAP
Comment: NEGATIVE
Diagnosis: NEGATIVE
High risk HPV: NEGATIVE

## 2021-03-21 ENCOUNTER — Encounter: Payer: Self-pay | Admitting: Obstetrics

## 2021-03-28 ENCOUNTER — Other Ambulatory Visit: Payer: Managed Care, Other (non HMO)

## 2021-03-28 ENCOUNTER — Other Ambulatory Visit: Payer: Self-pay

## 2021-03-28 ENCOUNTER — Other Ambulatory Visit (HOSPITAL_COMMUNITY)
Admission: RE | Admit: 2021-03-28 | Discharge: 2021-03-28 | Disposition: A | Discharge status: Home / Self Care | Payer: Managed Care, Other (non HMO) | Source: Ambulatory Visit | Attending: Obstetrics & Gynecology | Admitting: Obstetrics & Gynecology

## 2021-03-28 ENCOUNTER — Encounter: Payer: Self-pay | Admitting: Obstetrics & Gynecology

## 2021-03-28 ENCOUNTER — Ambulatory Visit (INDEPENDENT_AMBULATORY_CARE_PROVIDER_SITE_OTHER): Payer: Managed Care, Other (non HMO) | Admitting: Obstetrics & Gynecology

## 2021-03-28 VITALS — BP 120/80 | Ht 67.0 in | Wt 187.0 lb

## 2021-03-28 DIAGNOSIS — N841 Polyp of cervix uteri: Secondary | ICD-10-CM | POA: Diagnosis not present

## 2021-03-28 NOTE — Progress Notes (Signed)
  History of Present Illness:  Jennifer Pace is a 55 y.o. who recently underwent annual exam w CNM Eunice Blase who noted an abnormal cervical exam.  She presents for discussion and plan of management. No bleeding.  She has restarted ERT.  PMHx: She  has no past medical history on file. Also,  has a past surgical history that includes Colonoscopy (N/A, 05/23/2015) and Abdominal hysterectomy., family history includes Heart Problems in her father; Kidney failure in her mother.,  reports that she has never smoked. She has never used smokeless tobacco. She reports that she does not drink alcohol and does not use drugs. Current Meds  Medication Sig  . estradiol (ESTRACE) 1 MG tablet Take 1 tablet (1 mg total) by mouth daily.  . Multiple Vitamins-Minerals (MULTIVITAMIN WITH MINERALS) tablet Take by mouth.  . Also, has No Known Allergies..  Review of Systems  All other systems reviewed and are negative.   Physical Exam:  BP 120/80   Ht 5\' 7"  (1.702 m)   Wt 187 lb (84.8 kg)   BMI 29.29 kg/m  Body mass index is 29.29 kg/m. Constitutional: Well nourished, well developed female in no acute distress.  Abdomen: diffusely non tender to palpation, non distended, and no masses, hernias Neuro: Grossly intact Psych:  Normal mood and affect.     Cervix visualized and polyp noted.  Ring forcep applied to cervix and twisting motion removed polyp intact.  Hemostasis obtained.    Assessment:  Problem List Items Addressed This Visit     Cervical polyp    -  Primary   Relevant Orders   Surgical pathology      Plan: Detailed discussion of results today, and phone f/u for path results. Options for management discussed. Info provided.  She was amenable to this plan and we will see her back for annual/PRN.  , MD, Annamarie Major Ob/Gyn, [facility] 03/28/2021  10:57 AM

## 2021-03-29 LAB — LIPID PANEL WITH LDL/HDL RATIO
Cholesterol, Total: 229 mg/dL — ABNORMAL HIGH (ref 100–199)
HDL: 63 mg/dL (ref >39)
LDL Chol Calc (NIH): 157 mg/dL — ABNORMAL HIGH (ref 0–99)
LDL/HDL Ratio: 2.5 ratio (ref 0.0–3.2)
Triglycerides: 54 mg/dL (ref 0–149)
VLDL Cholesterol Cal: 9 mg/dL (ref 5–40)

## 2021-03-29 LAB — COMPREHENSIVE METABOLIC PANEL
ALT: 10 IU/L (ref 0–32)
AST: 20 IU/L (ref 0–40)
Albumin/Globulin Ratio: 1.3 (ref 1.2–2.2)
Albumin: 3.9 g/dL (ref 3.8–4.9)
Alkaline Phosphatase: 87 IU/L (ref 44–121)
BUN/Creatinine Ratio: 13 (ref 9–23)
BUN: 13 mg/dL (ref 6–24)
Bilirubin Total: <0.2 mg/dL (ref 0.0–1.2)
CO2: 17 mmol/L — ABNORMAL LOW (ref 20–29)
Calcium: 9 mg/dL (ref 8.7–10.2)
Chloride: 102 mmol/L (ref 96–106)
Creatinine, Ser: 0.98 mg/dL (ref 0.57–1.00)
Globulin, Total: 3.1 g/dL (ref 1.5–4.5)
Glucose: 99 mg/dL (ref 65–99)
Potassium: 4.5 mmol/L (ref 3.5–5.2)
Sodium: 139 mmol/L (ref 134–144)
Total Protein: 7 g/dL (ref 6.0–8.5)
eGFR: 68 mL/min/{1.73_m2} (ref >59)

## 2021-03-29 LAB — SURGICAL PATHOLOGY

## 2021-03-29 LAB — TSH: TSH: 2.03 u[IU]/mL (ref 0.450–4.500)

## 2021-04-05 ENCOUNTER — Ambulatory Visit (INDEPENDENT_AMBULATORY_CARE_PROVIDER_SITE_OTHER): Payer: Managed Care, Other (non HMO) | Admitting: Obstetrics and Gynecology

## 2021-04-05 ENCOUNTER — Encounter: Payer: Self-pay | Admitting: Obstetrics and Gynecology

## 2021-04-05 ENCOUNTER — Other Ambulatory Visit: Payer: Self-pay

## 2021-04-05 NOTE — Progress Notes (Signed)
Patient left without being seen, needed to return to work

## 2021-05-02 ENCOUNTER — Other Ambulatory Visit: Payer: Self-pay | Admitting: Obstetrics & Gynecology

## 2021-05-02 DIAGNOSIS — Z1231 Encounter for screening mammogram for malignant neoplasm of breast: Secondary | ICD-10-CM

## 2021-05-29 ENCOUNTER — Other Ambulatory Visit: Payer: Self-pay

## 2021-05-29 ENCOUNTER — Ambulatory Visit
Admission: RE | Admit: 2021-05-29 | Discharge: 2021-05-29 | Disposition: A | Discharge status: Home / Self Care | Payer: Managed Care, Other (non HMO) | Source: Ambulatory Visit | Attending: Obstetrics & Gynecology | Admitting: Obstetrics & Gynecology

## 2021-05-29 DIAGNOSIS — Z1231 Encounter for screening mammogram for malignant neoplasm of breast: Secondary | ICD-10-CM | POA: Insufficient documentation

## 2021-05-30 ENCOUNTER — Inpatient Hospital Stay
Admission: RE | Admit: 2021-05-30 | Discharge: 2021-05-30 | Disposition: A | Discharge status: Home / Self Care | Payer: Self-pay | Source: Ambulatory Visit | Attending: *Deleted | Admitting: *Deleted

## 2021-05-30 ENCOUNTER — Other Ambulatory Visit: Payer: Self-pay | Admitting: *Deleted

## 2021-05-30 DIAGNOSIS — Z1231 Encounter for screening mammogram for malignant neoplasm of breast: Secondary | ICD-10-CM

## 2021-08-02 ENCOUNTER — Ambulatory Visit: Payer: Managed Care, Other (non HMO) | Admitting: Adult Health

## 2021-08-03 ENCOUNTER — Telehealth: Payer: Self-pay

## 2021-08-03 NOTE — Telephone Encounter (Signed)
Pt calling; having severe stomach pain and chills.  [phone]  Pt states sxs started Sunday; they come and go; is not eating b/c she thinks it makes her stomach hurt; is having Bms and peeing okay; pain is in upper abd; no PCP since now works from home.  Adv probably not GYN related since upper abd and not lower where female parts are; to go to an Urgent Care for eval to get tx started; to ck c ins to see who is in network to find new PCP.

## 2021-08-24 ENCOUNTER — Ambulatory Visit: Payer: Managed Care, Other (non HMO)

## 2021-08-24 ENCOUNTER — Other Ambulatory Visit: Payer: Self-pay

## 2021-08-24 ENCOUNTER — Ambulatory Visit
Admission: EM | Admit: 2021-08-24 | Discharge: 2021-08-24 | Disposition: A | Discharge status: Home / Self Care | Payer: Managed Care, Other (non HMO) | Attending: Emergency Medicine | Admitting: Emergency Medicine

## 2021-08-24 ENCOUNTER — Encounter: Payer: Self-pay | Admitting: Emergency Medicine

## 2021-08-24 DIAGNOSIS — H11152 Pinguecula, left eye: Secondary | ICD-10-CM | POA: Diagnosis not present

## 2021-08-24 MED ORDER — FLUORESCEIN SODIUM 1 MG OP STRP: 1.0000 | ORAL_STRIP | Freq: Once | OPHTHALMIC | Status: DC

## 2021-08-24 MED ORDER — ARTIFICIAL TEARS OPHTHALMIC OINT: TOPICAL_OINTMENT | Freq: Every day | OPHTHALMIC | 0 refills | Status: DC

## 2021-08-24 MED ORDER — NAPHAZOLINE-PHENIRAMINE 0.025-0.3 % OP SOLN: 1.0000 [drp] | Freq: Four times a day (QID) | OPHTHALMIC | 0 refills | Status: DC | PRN

## 2021-08-24 MED ORDER — TETRACAINE HCL 0.5 % OP SOLN: 1.0000 [drp] | Freq: Once | OPHTHALMIC | Status: DC

## 2021-08-24 NOTE — ED Triage Notes (Signed)
Red eye for a month.  Went to [facility] and received eye drops, no relief at all.  No change in vision.  Tender on inner aspect of left eye.  Patient does wear glasses, but does not wear contacts.  Patient does have an eye appt next month

## 2021-08-24 NOTE — Discharge Instructions (Addendum)
You appear to have a pinguecula, which is a benign condition.  Try artificial tears such as Systane, Lacri-Lube at night.  You can use the Naphcon-A, but use it sparingly.  You can get increased eye pressure or rebound if you use it too much.

## 2021-08-24 NOTE — ED Provider Notes (Signed)
HPI  SUBJECTIVE:  Jennifer Pace is a 55 y.o. female who presents with 1 month of left medial eye irritation, sensitivity, conjunctival injection.  It is constant, daily.  She was seen at [facility] on [facility] , prescribed an unknown eyedrop for 10 days, which she states that did not work.  No visual changes, photophobia, pain with extraocular movements, periorbital erythema, edema, foreign body sensation, allergy symptoms, discharge, increased tearing, halos around lights, pain worse in the dark.  She wears glasses for distance.  She does not wear contacts.  She has not tried anything other than the unknown eyedrop.  No aggravating or alleviating factors.  He has no past medical history.  PMD: She is establishing care with a local PMD next month.  Ophthalmology: My eye doctor.  She states that she already has an appointment with them in early October.  Patient was seen at another urgent care on 8/13, found to have chemosis, was sent home with Patanol.  History reviewed. No pertinent past medical history.  Past Surgical History:  Procedure Laterality Date   ABDOMINAL HYSTERECTOMY     COLONOSCOPY N/A 05/23/2015   Procedure: COLONOSCOPY;  Surgeon: Scot Jun, MD;  Location: [facility] ENDOSCOPY;  Service: Endoscopy;  Laterality: N/A;   REDUCTION MAMMAPLASTY      Family History  Problem Relation Age of Onset   Kidney failure Mother    Heart Problems Father     Social History   Tobacco Use   Smoking status: Never   Smokeless tobacco: Never  Vaping Use   Vaping Use: Never used  Substance Use Topics   Alcohol use: No   Drug use: No    No current facility-administered medications for this encounter.  Current Outpatient Medications:    naphazoline-pheniramine (NAPHCON-A) 0.025-0.3 % ophthalmic solution, Place 1 drop into the left eye 4 (four) times daily as needed for eye irritation., Disp: 15 mL, Rfl: 0   artificial tears (LACRILUBE) OINT ophthalmic ointment, Place into the  left eye at bedtime., Disp: 3.5 g, Rfl: 0  No Known Allergies   ROS  As noted in HPI.   Physical Exam  BP 140/76 (BP Location: Left Arm) Comment (BP Location): large cuff  Pulse 82   Temp 98.5 F (36.9 C) (Oral)   Resp 18   SpO2 95%   Constitutional: Well developed, well nourished, no acute distress Eyes:  EOMI, PERRLA.  No direct or consensual photophobia.  Positive conjunctival injection medial left eyelid.  The rest of the conjunctiva is clear.  No foreign body seen on lid eversion.  positive pinguecula medially that does not cross into the cornea.  No corneal foreign body seen. Visual acuity: L 20/20 R 20/16 Bilateral 20/16  HENT: Normocephalic, atraumatic,mucus membranes moist Respiratory: Normal inspiratory effort Cardiovascular: Normal rate GI: nondistended skin: No rash, skin intact Musculoskeletal: no deformities Neurologic: Alert & oriented x 3, no focal neuro deficits Psychiatric: Speech and behavior appropriate   ED Course   Medications - No data to display  Orders Placed This Encounter  Procedures   Visual acuity screening    Standing Status:   Standing    Number of Occurrences:   1    No results found for this or any previous visit (from the past 24 hour(s)). No results found.  ED Clinical Impression  1. Pinguecula of left eye      ED Assessment/Plan  Outside records reviewed.  Presentation with a pinguecula.  Doubt infectious cause, episcleritis.  There is  no evidence of a corneal foreign body.  It has been present for a month.   will try artificial tears and sparing use of a topical vasoconstrictor such as Naphcon a.  Follow-up with her ophthalmologist if not getting better.  She already has an appointment scheduled in early October.  Discussed MDM, treatment plan, and plan for follow-up with patient.  patient agrees with plan.   Meds ordered this encounter  Medications   DISCONTD: tetracaine (PONTOCAINE) 0.5 % ophthalmic solution 1  drop   DISCONTD: fluorescein ophthalmic strip 1 strip   naphazoline-pheniramine (NAPHCON-A) 0.025-0.3 % ophthalmic solution    Sig: Place 1 drop into the left eye 4 (four) times daily as needed for eye irritation.    Dispense:  15 mL    Refill:  0   DISCONTD: artificial tears (LACRILUBE) OINT ophthalmic ointment    Sig: Place into both eyes at bedtime.    Dispense:  3.5 g    Refill:  0   artificial tears (LACRILUBE) OINT ophthalmic ointment    Sig: Place into the left eye at bedtime.    Dispense:  3.5 g    Refill:  0      *This clinic note was created using Scientist, clinical (histocompatibility and immunogenetics). Therefore, there may be occasional mistakes despite careful proofreading.  ?    Domenick Gong, MD 08/24/21 [phone]

## 2021-10-09 ENCOUNTER — Ambulatory Visit: Payer: Self-pay | Admitting: Family Medicine

## 2021-10-12 ENCOUNTER — Ambulatory Visit: Payer: Managed Care, Other (non HMO) | Admitting: Internal Medicine

## 2021-10-12 ENCOUNTER — Encounter: Payer: Self-pay | Admitting: Internal Medicine

## 2021-10-12 ENCOUNTER — Other Ambulatory Visit: Payer: Self-pay

## 2021-10-12 VITALS — BP 138/88 | HR 84 | Temp 98.4°F | Resp 18 | Ht 68.0 in | Wt 184.0 lb

## 2021-10-12 DIAGNOSIS — R7303 Prediabetes: Secondary | ICD-10-CM

## 2021-10-12 DIAGNOSIS — Z6827 Body mass index (BMI) 27.0-27.9, adult: Secondary | ICD-10-CM

## 2021-10-12 DIAGNOSIS — E7800 Pure hypercholesterolemia, unspecified: Secondary | ICD-10-CM | POA: Diagnosis not present

## 2021-10-12 DIAGNOSIS — N951 Menopausal and female climacteric states: Secondary | ICD-10-CM

## 2021-10-12 DIAGNOSIS — H5712 Ocular pain, left eye: Secondary | ICD-10-CM | POA: Diagnosis not present

## 2021-10-12 DIAGNOSIS — Z23 Encounter for immunization: Secondary | ICD-10-CM

## 2021-10-12 DIAGNOSIS — R6889 Other general symptoms and signs: Secondary | ICD-10-CM

## 2021-10-12 DIAGNOSIS — E663 Overweight: Secondary | ICD-10-CM

## 2021-10-12 NOTE — Progress Notes (Signed)
HPI  Patient presents the clinic today to establish care and for management of the conditions listed below.  Postmenopausal Hot Flashes: She is not currently taking any medications for this at this time.  HLD: Her last LDL was 157, triglycerides 54, 03/2021.  She is not taking any cholesterol-lowering medication at this time.  She tries to consume a low-fat diet.  Prediabetes: Her last A1c was 5.8%, 09/2018.  She is not taking any oral diabetic medication at this time.  She does not check her sugars.  She also reports left eye redness, discomfort and sensitivity to sunlight.  She reports this started 2 months ago.  She reports she saw an ophthalmologist and was diagnosed with conjunctivitis.  She was given eyedrops which helped to clear out the redness but she reports the discomfort and sensitivity are still present.  She has not noticed any drainage from the eye.  She denies headache, runny nose, nasal congestion, ear pain or sore throat.  She does have some sinus pressure not sure if this is a contributing factor.  She does take Sudafed intermittently with some relief of symptoms.    No past medical history on file.  Current Outpatient Medications  Medication Sig Dispense Refill   artificial tears (LACRILUBE) OINT ophthalmic ointment Place into the left eye at bedtime. 3.5 g 0   naphazoline-pheniramine (NAPHCON-A) 0.025-0.3 % ophthalmic solution Place 1 drop into the left eye 4 (four) times daily as needed for eye irritation. 15 mL 0   No current facility-administered medications for this visit.    No Known Allergies  Family History  Problem Relation Age of Onset   Kidney failure Mother    Heart Problems Father     Social History   Socioeconomic History   Marital status: Married    Spouse name: Not on file   Number of children: Not on file   Years of education: Not on file   Highest education level: Not on file  Occupational History   Not on file  Tobacco Use   Smoking  status: Never   Smokeless tobacco: Never  Vaping Use   Vaping Use: Never used  Substance and Sexual Activity   Alcohol use: No   Drug use: No   Sexual activity: Yes    Birth control/protection: Surgical  Other Topics Concern   Not on file  Social History Narrative   Not on file   Social Determinants of Health   Financial Resource Strain: Not on file  Food Insecurity: Not on file  Transportation Needs: Not on file  Physical Activity: Not on file  Stress: Not on file  Social Connections: Not on file  Intimate Partner Violence: Not on file    ROS:  Constitutional: Denies fever, malaise, fatigue, headache or abrupt weight changes.  HEENT: Patient reports eye discomfort and sensitivity to light.  Denies eye redness, ear pain, ringing in the ears, wax buildup, runny nose, nasal congestion, bloody nose, or sore throat. Respiratory: Denies difficulty breathing, shortness of breath, cough or sputum production.   Cardiovascular: Denies chest pain, chest tightness, palpitations or swelling in the hands or feet.  Gastrointestinal: Denies abdominal pain, bloating, constipation, diarrhea or blood in the stool.  GU: Denies frequency, urgency, pain with urination, blood in urine, odor or discharge. Musculoskeletal: Denies decrease in range of motion, difficulty with gait, muscle pain or joint pain and swelling.  Skin: Denies redness, rashes, lesions or ulcercations.  Neurological: Patient reports intermittent hot flashes.  Denies dizziness, difficulty with  memory, difficulty with speech or problems with balance and coordination.  Psych: Denies anxiety, depression, SI/HI.  No other specific complaints in a complete review of systems (except as listed in HPI above).  PE:  BP 138/88   Pulse 84   Temp 98.4 F (36.9 C) (Oral)   Resp 18   Ht 5\' 8"  (1.727 m)   Wt 184 lb (83.5 kg)   SpO2 100%   BMI 27.98 kg/m   Wt Readings from Last 3 Encounters:  04/05/21 187 lb 6.4 oz (85 kg)   03/28/21 187 lb (84.8 kg)  03/15/21 187 lb (84.8 kg)    General: Appears her stated age, overweight, in NAD. HEENT: Head: normal shape and size; Eyes: sclera white,  EOMs intact;  Cardiovascular: Normal rate and rhythm. S1,S2 noted.  No murmur, rubs or gallops noted.  Pulmonary/Chest: Normal effort and positive vesicular breath sounds. No respiratory distress. No wheezes, rales or ronchi noted.  Musculoskeletal:  No difficulty with gait.  Neurological: Alert and oriented.  Psychiatric: Mood and affect normal. Behavior is normal. Judgment and thought content normal.      BMET    Component Value Date/Time   NA 139 03/28/2021 1034   NA 139 07/13/2013 0556   K 4.5 03/28/2021 1034   K 3.5 07/13/2013 0556   CL 102 03/28/2021 1034   CL 107 07/13/2013 0556   CO2 17 (L) 03/28/2021 1034   CO2 28 07/13/2013 0556   GLUCOSE 99 03/28/2021 1034   GLUCOSE 107 (H) 09/30/2020 0333   GLUCOSE 113 (H) 07/13/2013 0556   BUN 13 03/28/2021 1034   BUN 9 07/13/2013 0556   CREATININE 0.98 03/28/2021 1034   CREATININE 0.96 07/13/2013 0556   CALCIUM 9.0 03/28/2021 1034   CALCIUM 8.6 07/13/2013 0556   GFRNONAA >60 09/30/2020 0333   GFRNONAA >60 07/13/2013 0556   GFRAA >60 07/13/2013 0556    Lipid Panel     Component Value Date/Time   CHOL 229 (H) 03/28/2021 1034   TRIG 54 03/28/2021 1034   HDL 63 03/28/2021 1034   LDLCALC 157 (H) 03/28/2021 1034    CBC    Component Value Date/Time   WBC 8.1 09/30/2020 0333   RBC 4.75 09/30/2020 0333   HGB 12.3 09/30/2020 0333   HGB 12.5 07/13/2013 0556   HCT 38.8 09/30/2020 0333   HCT 38.2 07/13/2013 0556   PLT 280 09/30/2020 0333   PLT 282 07/13/2013 0556   MCV 81.7 09/30/2020 0333   MCV 83 07/13/2013 0556   MCH 25.9 (L) 09/30/2020 0333   MCHC 31.7 09/30/2020 0333   RDW 14.4 09/30/2020 0333   RDW 13.9 07/13/2013 0556    Hgb A1C No results found for: HGBA1C   Assessment and Plan:  Left Eye Discomfort and Sensitivity to Light:  ?   Related to sinus pressure Start Flonase 1 spray left nostril daily If no improvement would recommend he follow back up with ophthalmology  RTC in 1 year for your annual exam  07/15/2013, NP This visit occurred during the SARS-CoV-2 public health emergency.  Safety protocols were in place, including screening questions prior to the visit, additional usage of staff PPE, and extensive cleaning of exam room while observing appropriate contact time as indicated for disinfecting solutions.

## 2021-10-13 DIAGNOSIS — Z6827 Body mass index (BMI) 27.0-27.9, adult: Secondary | ICD-10-CM | POA: Insufficient documentation

## 2021-10-13 DIAGNOSIS — E7800 Pure hypercholesterolemia, unspecified: Secondary | ICD-10-CM | POA: Insufficient documentation

## 2021-10-13 DIAGNOSIS — R7303 Prediabetes: Secondary | ICD-10-CM | POA: Insufficient documentation

## 2021-10-13 DIAGNOSIS — E663 Overweight: Secondary | ICD-10-CM | POA: Insufficient documentation

## 2021-10-13 LAB — COMPLETE METABOLIC PANEL WITH GFR
AG Ratio: 1.3 (calc) (ref 1.0–2.5)
ALT: 7 U/L (ref 6–29)
AST: 14 U/L (ref 10–35)
Albumin: 4 g/dL (ref 3.6–5.1)
Alkaline phosphatase (APISO): 69 U/L (ref 37–153)
BUN: 14 mg/dL (ref 7–25)
CO2: 28 mmol/L (ref 20–32)
Calcium: 9.4 mg/dL (ref 8.6–10.4)
Chloride: 104 mmol/L (ref 98–110)
Creat: 0.88 mg/dL (ref 0.50–1.03)
Globulin: 3.2 g/dL (calc) (ref 1.9–3.7)
Glucose, Bld: 96 mg/dL (ref 65–139)
Potassium: 4.5 mmol/L (ref 3.5–5.3)
Sodium: 138 mmol/L (ref 135–146)
Total Bilirubin: 0.3 mg/dL (ref 0.2–1.2)
Total Protein: 7.2 g/dL (ref 6.1–8.1)
eGFR: 77 mL/min/{1.73_m2} (ref >=60)

## 2021-10-13 LAB — HEMOGLOBIN A1C
Hgb A1c MFr Bld: 5.7 % of total Hgb — ABNORMAL HIGH (ref <5.7)
Mean Plasma Glucose: 117 mg/dL
eAG (mmol/L): 6.5 mmol/L

## 2021-10-13 LAB — LIPID PANEL
Cholesterol: 226 mg/dL — ABNORMAL HIGH (ref <200)
HDL: 60 mg/dL (ref >=50)
LDL Cholesterol (Calc): 149 mg/dL (calc) — ABNORMAL HIGH
Non-HDL Cholesterol (Calc): 166 mg/dL (calc) — ABNORMAL HIGH (ref <130)
Total CHOL/HDL Ratio: 3.8 (calc) (ref <5.0)
Triglycerides: 70 mg/dL (ref <150)

## 2021-10-13 NOTE — Assessment & Plan Note (Signed)
C-Met and lipid profile today Encouraged her to consume a low-fat diet 

## 2021-10-13 NOTE — Assessment & Plan Note (Addendum)
Not medicated We will continue to monitor at this time

## 2021-10-13 NOTE — Assessment & Plan Note (Signed)
Encourage diet and exercise for weight loss 

## 2021-10-13 NOTE — Patient Instructions (Signed)
Heart-Healthy Eating Plan Heart-healthy meal planning includes: Eating less unhealthy fats. Eating more healthy fats. Making other changes in your diet. Talk with your doctor or a diet specialist (dietitian) to create an eating plan that is right for you. What is my plan? Your doctor may recommend an eating plan that includes: Total fat: ______% or less of total calories a day. Saturated fat: ______% or less of total calories a day. Cholesterol: less than _________mg a day. What are tips for following this plan? Cooking Avoid frying your food. Try to bake, boil, grill, or broil it instead. You can also reduce fat by: Removing the skin from poultry. Removing all visible fats from meats. Steaming vegetables in water or broth. Meal planning  At meals, divide your plate into four equal parts: Fill one-half of your plate with vegetables and green salads. Fill one-fourth of your plate with whole grains. Fill one-fourth of your plate with lean protein foods. Eat 4-5 servings of vegetables per day. A serving of vegetables is: 1 cup of raw or cooked vegetables. 2 cups of raw leafy greens. Eat 4-5 servings of fruit per day. A serving of fruit is: 1 medium whole fruit.  cup of dried fruit.  cup of fresh, frozen, or canned fruit.  cup of 100% fruit juice. Eat more foods that have soluble fiber. These are apples, broccoli, carrots, beans, peas, and barley. Try to get 20-30 g of fiber per day. Eat 4-5 servings of nuts, legumes, and seeds per week: 1 serving of dried beans or legumes equals  cup after being cooked. 1 serving of nuts is  cup. 1 serving of seeds equals 1 tablespoon. General information Eat more home-cooked food. Eat less restaurant, buffet, and fast food. Limit or avoid alcohol. Limit foods that are high in starch and sugar. Avoid fried foods. Lose weight if you are overweight. Keep track of how much salt (sodium) you eat. This is important if you have high blood  pressure. Ask your doctor to tell you more about this. Try to add vegetarian meals each week. Fats Choose healthy fats. These include olive oil and canola oil, flaxseeds, walnuts, almonds, and seeds. Eat more omega-3 fats. These include salmon, mackerel, sardines, tuna, flaxseed oil, and ground flaxseeds. Try to eat fish at least 2 times each week. Check food labels. Avoid foods with trans fats or high amounts of saturated fat. Limit saturated fats. These are often found in animal products, such as meats, butter, and cream. These are also found in plant foods, such as palm oil, palm kernel oil, and coconut oil. Avoid foods with partially hydrogenated oils in them. These have trans fats. Examples are stick margarine, some tub margarines, cookies, crackers, and other baked goods. What foods can I eat? Fruits All fresh, canned (in natural juice), or frozen fruits. Vegetables Fresh or frozen vegetables (raw, steamed, roasted, or grilled). Green salads. Grains Most grains. Choose whole wheat and whole grains most of the time. Rice and pasta, including brown rice and pastas made with whole wheat. Meats and other proteins Lean, well-trimmed beef, veal, pork, and lamb. Chicken and turkey without skin. All fish and shellfish. Wild duck, rabbit, pheasant, and venison. Egg whites or low-cholesterol egg substitutes. Dried beans, peas, lentils, and tofu. Seeds and most nuts. Dairy Low-fat or nonfat cheeses, including ricotta and mozzarella. Skim or 1% milk that is liquid, powdered, or evaporated. Buttermilk that is made with low-fat milk. Nonfat or low-fat yogurt. Fats and oils Non-hydrogenated (trans-free) margarines. Vegetable oils, including   soybean, sesame, sunflower, olive, peanut, safflower, corn, canola, and cottonseed. Salad dressings or mayonnaise made with a vegetable oil. Beverages Mineral water. Coffee and tea. Diet carbonated beverages. Sweets and desserts Sherbet, gelatin, and fruit ice.  Small amounts of dark chocolate. Limit all sweets and desserts. Seasonings and condiments All seasonings and condiments. The items listed above may not be a complete list of foods and drinks you can eat. Contact a dietitian for more options. What foods should I avoid? Fruits Canned fruit in heavy syrup. Fruit in cream or butter sauce. Fried fruit. Limit coconut. Vegetables Vegetables cooked in cheese, cream, or butter sauce. Fried vegetables. Grains Breads that are made with saturated or trans fats, oils, or whole milk. Croissants. Sweet rolls. Donuts. High-fat crackers, such as cheese crackers. Meats and other proteins Fatty meats, such as hot dogs, ribs, sausage, bacon, rib-eye roast or steak. High-fat deli meats, such as salami and bologna. Caviar. Domestic duck and goose. Organ meats, such as liver. Dairy Cream, sour cream, cream cheese, and creamed cottage cheese. Whole-milk cheeses. Whole or 2% milk that is liquid, evaporated, or condensed. Whole buttermilk. Cream sauce or high-fat cheese sauce. Yogurt that is made from whole milk. Fats and oils Meat fat, or shortening. Cocoa butter, hydrogenated oils, palm oil, coconut oil, palm kernel oil. Solid fats and shortenings, including bacon fat, salt pork, lard, and butter. Nondairy cream substitutes. Salad dressings with cheese or sour cream. Beverages Regular sodas and juice drinks with added sugar. Sweets and desserts Frosting. Pudding. Cookies. Cakes. Pies. Milk chocolate or white chocolate. Buttered syrups. Full-fat ice cream or ice cream drinks. The items listed above may not be a complete list of foods and drinks to avoid. Contact a dietitian for more information. Summary Heart-healthy meal planning includes eating less unhealthy fats, eating more healthy fats, and making other changes in your diet. Eat a balanced diet. This includes fruits and vegetables, low-fat or nonfat dairy, lean protein, nuts and legumes, whole grains, and  heart-healthy oils and fats. This information is not intended to replace advice given to you by your health care provider. Make sure you discuss any questions you have with your health care provider. Document Revised: 04/13/2021 Document Reviewed: 04/13/2021 Elsevier Patient Education  2022 Elsevier Inc.  

## 2021-10-13 NOTE — Assessment & Plan Note (Signed)
A1c today Encourage low-carb diet and exercise for weight loss 

## 2021-10-17 ENCOUNTER — Encounter: Payer: Self-pay | Admitting: Internal Medicine

## 2021-10-17 ENCOUNTER — Telehealth: Payer: Self-pay

## 2021-10-17 DIAGNOSIS — E7800 Pure hypercholesterolemia, unspecified: Secondary | ICD-10-CM

## 2021-10-17 MED ORDER — ATORVASTATIN CALCIUM 10 MG PO TABS
10.0000 mg | ORAL_TABLET | Freq: Every day | ORAL | 2 refills | Status: DC
Start: 2021-10-17 — End: 2022-01-12

## 2021-10-17 NOTE — Telephone Encounter (Signed)
The pt agreed to starting on cholesterol medication.

## 2021-10-17 NOTE — Addendum Note (Signed)
Addended by: Lorre Munroe on: 10/17/2021 01:56 PM   Modules accepted: Orders

## 2021-10-17 NOTE — Telephone Encounter (Signed)
Atorvastatin ordered. Will need 3 month lab only appt to recheck lipids.

## 2021-10-17 NOTE — Telephone Encounter (Signed)
Copied from CRM [id number]. Topic: General - Other >> Oct 17, 2021 10:31 AM Marylen Ponto wrote: Reason for CRM: Pt stated she received her lab results and wanted the Rx to be sent to [id number] [place], [place] - [address]  Phone: [phone]   Fax: [phone]

## 2021-10-27 ENCOUNTER — Other Ambulatory Visit: Payer: Managed Care, Other (non HMO)

## 2021-10-27 ENCOUNTER — Other Ambulatory Visit: Payer: Self-pay

## 2021-10-27 DIAGNOSIS — E7800 Pure hypercholesterolemia, unspecified: Secondary | ICD-10-CM

## 2022-01-12 ENCOUNTER — Other Ambulatory Visit: Payer: Self-pay | Admitting: Internal Medicine

## 2022-01-12 NOTE — Telephone Encounter (Signed)
Requested Prescriptions  Pending Prescriptions Disp Refills   atorvastatin (LIPITOR) 10 MG tablet [Pharmacy Med Name: ATORVASTATIN 10 MG TABLET] 90 tablet     Sig: TAKE 1 TABLET BY MOUTH EVERY DAY     Cardiovascular:  Antilipid - Statins Failed - 01/12/2022  1:28 AM      Failed - Total Cholesterol in normal range and within 360 days    Cholesterol, Total  Date Value Ref Range Status  03/28/2021 229 (H) 100 - 199 mg/dL Final   Cholesterol  Date Value Ref Range Status  10/12/2021 226 (H) <200 mg/dL Final         Failed - LDL in normal range and within 360 days    LDL Cholesterol (Calc)  Date Value Ref Range Status  10/12/2021 149 (H) mg/dL (calc) Final    Comment:    Reference range: <100 . Desirable range <100 mg/dL for primary prevention;   <70 mg/dL for patients with CHD or diabetic patients  with > or = 2 CHD risk factors. Marland Kitchen LDL-C is now calculated using the Martin-Hopkins  calculation, which is a validated novel method providing  better accuracy than the Friedewald equation in the  estimation of LDL-C.  Cresenciano Genre et al. Annamaria Helling. [id number]): 2061-2068  (http://education.[facility].com/faq/FAQ164)          Passed - HDL in normal range and within 360 days    HDL  Date Value Ref Range Status  10/12/2021 60 > OR = 50 mg/dL Final  03/28/2021 63 >39 mg/dL Final         Passed - Triglycerides in normal range and within 360 days    Triglycerides  Date Value Ref Range Status  10/12/2021 70 <150 mg/dL Final         Passed - Patient is not pregnant      Passed - Valid encounter within last 12 months    Recent Outpatient Visits          3 months ago Pure hypercholesterolemia   [facility] [place], [place], [place]

## 2022-01-18 ENCOUNTER — Other Ambulatory Visit: Payer: Managed Care, Other (non HMO)

## 2022-01-23 ENCOUNTER — Other Ambulatory Visit: Payer: Managed Care, Other (non HMO)

## 2022-01-23 LAB — COMPLETE METABOLIC PANEL WITH GFR
AG Ratio: 1.1 (calc) (ref 1.0–2.5)
ALT: 8 U/L (ref 6–29)
AST: 15 U/L (ref 10–35)
Albumin: 3.7 g/dL (ref 3.6–5.1)
Alkaline phosphatase (APISO): 81 U/L (ref 37–153)
BUN: 11 mg/dL (ref 7–25)
CO2: 29 mmol/L (ref 20–32)
Calcium: 9.2 mg/dL (ref 8.6–10.4)
Chloride: 104 mmol/L (ref 98–110)
Creat: 0.89 mg/dL (ref 0.50–1.03)
Globulin: 3.5 g/dL (calc) (ref 1.9–3.7)
Glucose, Bld: 86 mg/dL (ref 65–99)
Potassium: 4.1 mmol/L (ref 3.5–5.3)
Sodium: 138 mmol/L (ref 135–146)
Total Bilirubin: 0.3 mg/dL (ref 0.2–1.2)
Total Protein: 7.2 g/dL (ref 6.1–8.1)
eGFR: 76 mL/min/{1.73_m2} (ref >=60)

## 2022-01-23 LAB — LIPID PANEL
Cholesterol: 178 mg/dL (ref <200)
HDL: 71 mg/dL (ref >=50)
LDL Cholesterol (Calc): 92 mg/dL (calc)
Non-HDL Cholesterol (Calc): 107 mg/dL (calc) (ref <130)
Total CHOL/HDL Ratio: 2.5 (calc) (ref <5.0)
Triglycerides: 61 mg/dL (ref <150)

## 2022-01-24 ENCOUNTER — Encounter: Payer: Self-pay | Admitting: Internal Medicine

## 2022-04-17 ENCOUNTER — Other Ambulatory Visit: Payer: Self-pay | Admitting: Internal Medicine

## 2022-04-17 DIAGNOSIS — Z1231 Encounter for screening mammogram for malignant neoplasm of breast: Secondary | ICD-10-CM

## 2022-05-30 ENCOUNTER — Ambulatory Visit
Admission: RE | Admit: 2022-05-30 | Discharge: 2022-05-30 | Disposition: A | Discharge status: Home / Self Care | Payer: Managed Care, Other (non HMO) | Source: Ambulatory Visit | Attending: Internal Medicine | Admitting: Internal Medicine

## 2022-05-30 DIAGNOSIS — Z1231 Encounter for screening mammogram for malignant neoplasm of breast: Secondary | ICD-10-CM | POA: Diagnosis present

## 2022-06-27 IMAGING — MG MM DIGITAL SCREENING BILAT W/ TOMO AND CAD
6 of 12 series · 6 of 36 positions shown · non-contrast
Comparison: Previous exam(s).

CLINICAL DATA: Screening.

EXAM:
DIGITAL SCREENING BILATERAL MAMMOGRAM WITH TOMOSYNTHESIS AND CAD
TECHNIQUE: Bilateral screening digital craniocaudal and mediolateral oblique
mammograms were obtained. Bilateral screening digital breast
tomosynthesis was performed. The images were evaluated with
computer-aided detection.

[L MLO synth-2D]
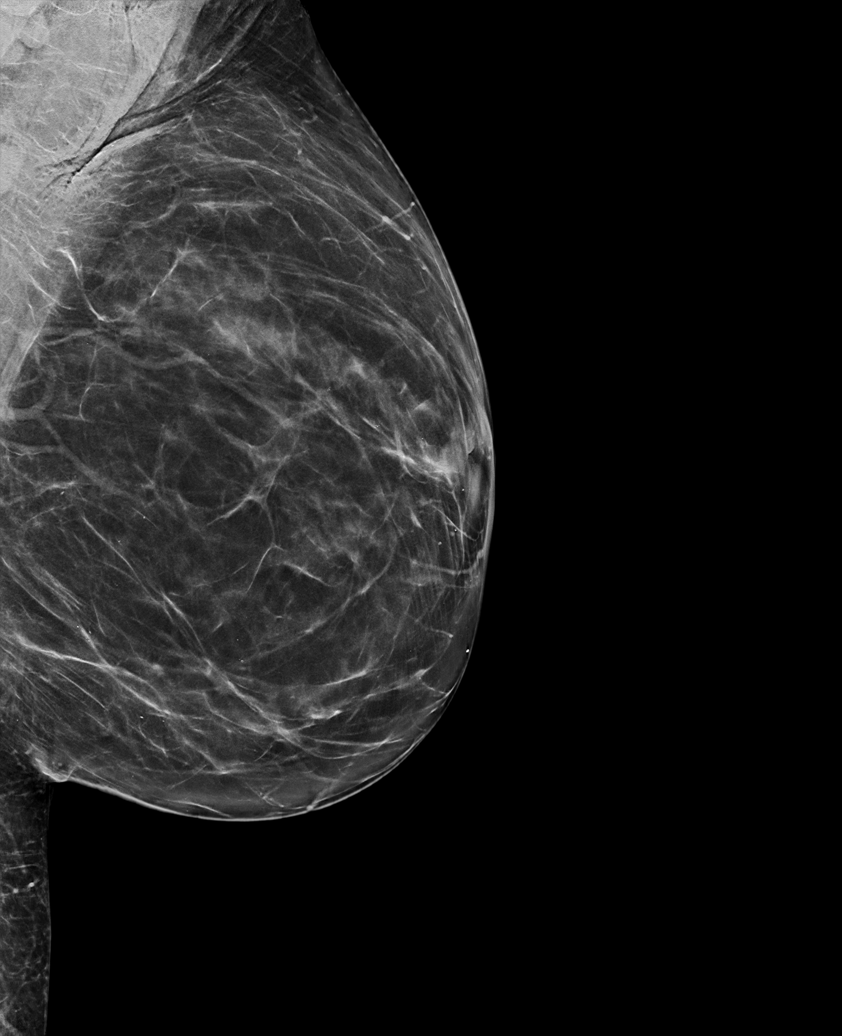

[R MLO synth-2D (1 of 2)]
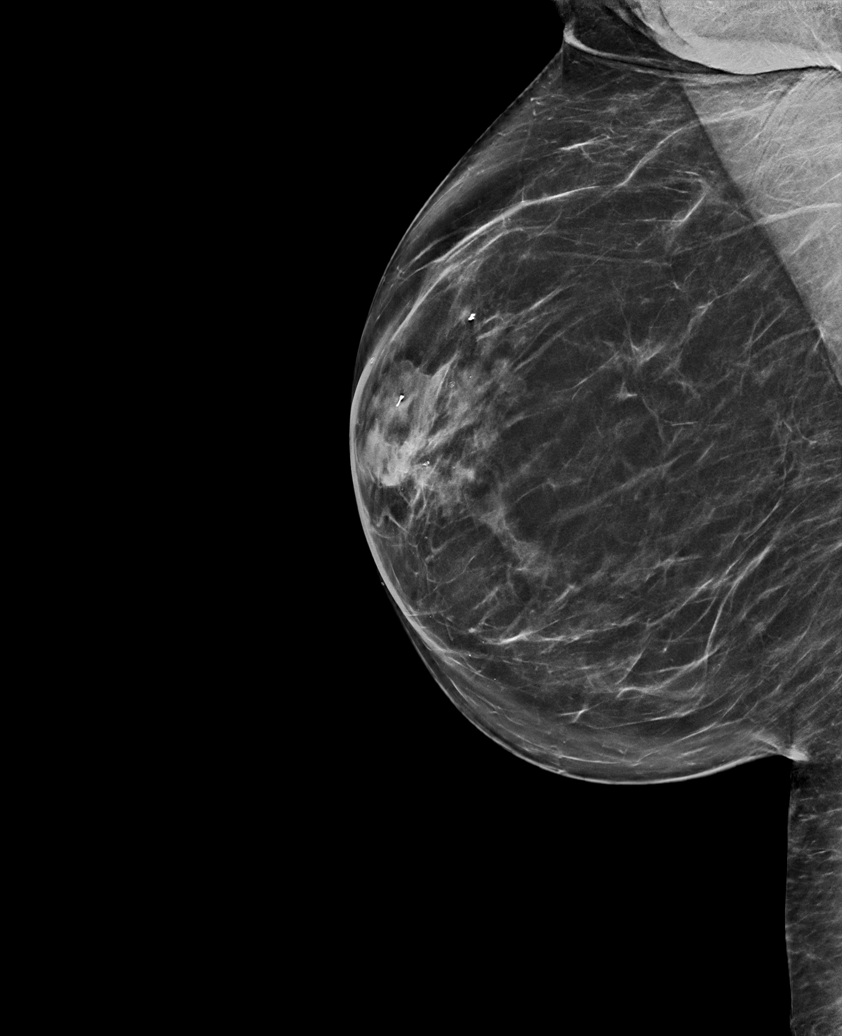

[R CV synth-2D]
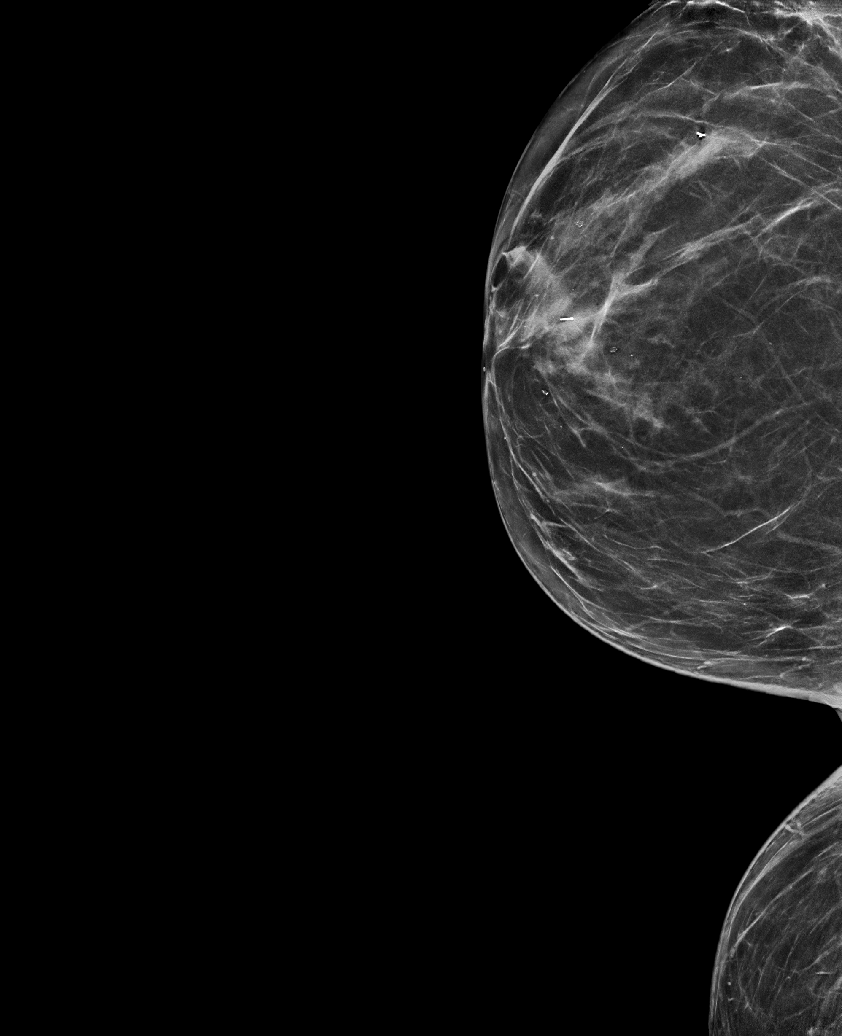

[L CC synth-2D]
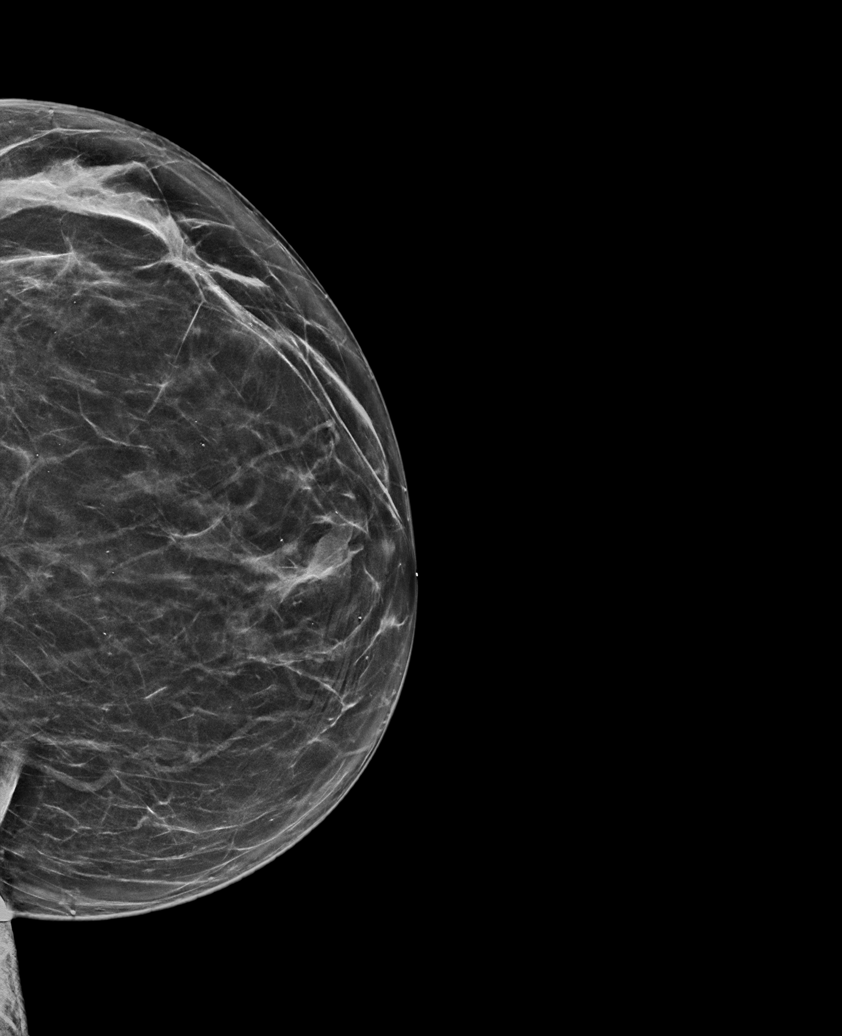

[R CC synth-2D]
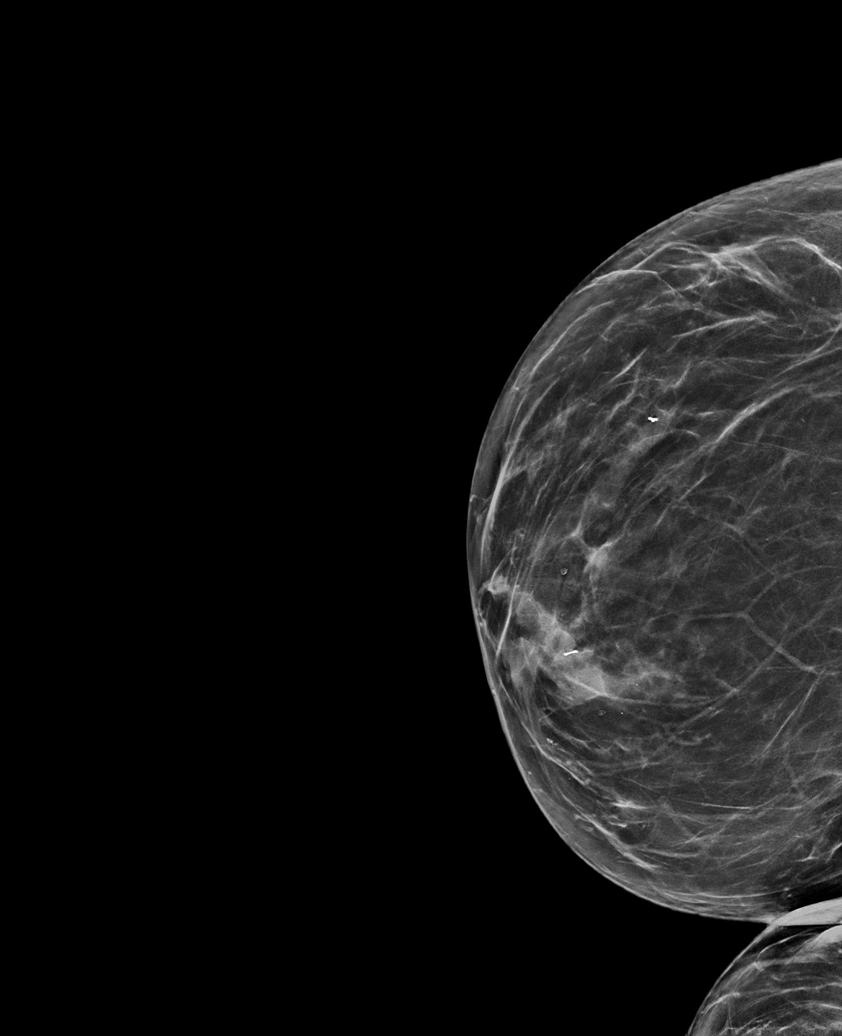

[R MLO synth-2D (2 of 2)]
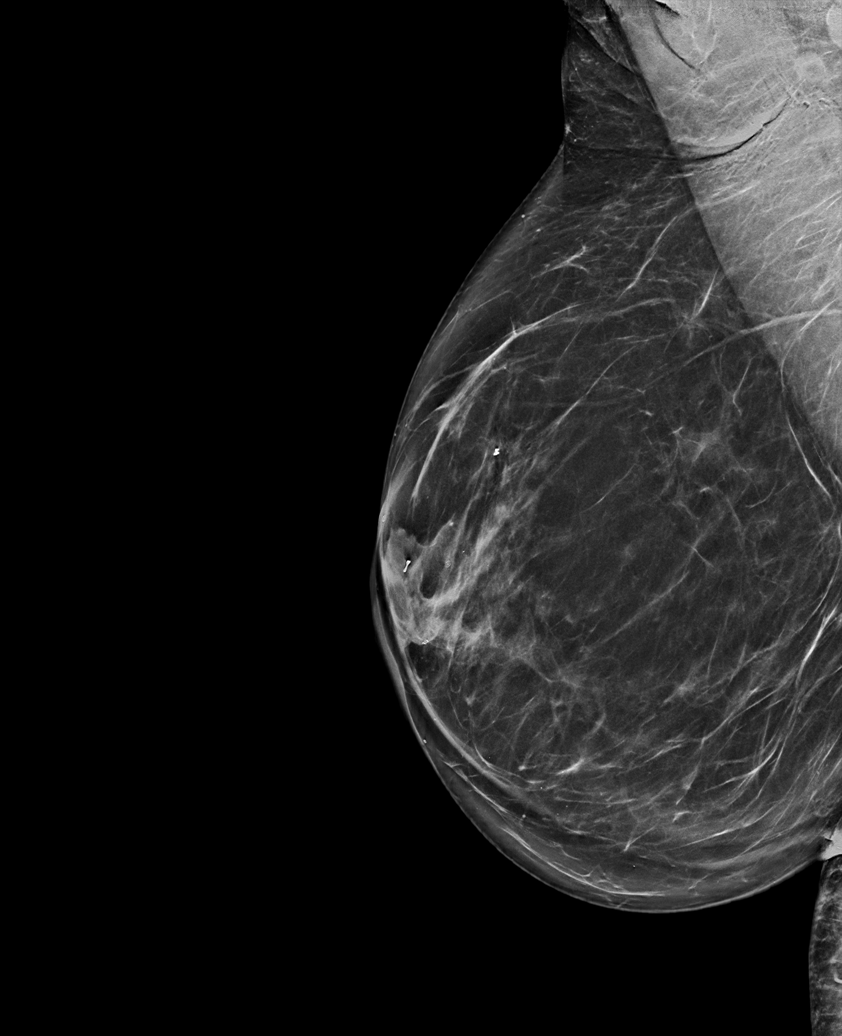

[6 of 36 positions shown; findings below may reference images not displayed]

ACR Breast Density Category b: There are scattered areas of
fibroglandular density.
FINDINGS: There are no findings suspicious for malignancy.
IMPRESSION: No mammographic evidence of malignancy. A result letter of this
screening mammogram will be mailed directly to the patient.

RECOMMENDATION:
Screening mammogram in one year. (Code:51-O-LD2)

BI-RADS CATEGORY  1: Negative.

## 2022-12-24 ENCOUNTER — Ambulatory Visit: Payer: Managed Care, Other (non HMO) | Admitting: Internal Medicine

## 2022-12-24 ENCOUNTER — Encounter: Payer: Self-pay | Admitting: Internal Medicine

## 2022-12-24 VITALS — BP 188/103 | HR 80 | Temp 96.8°F | Wt 180.0 lb

## 2022-12-24 DIAGNOSIS — E663 Overweight: Secondary | ICD-10-CM

## 2022-12-24 DIAGNOSIS — Z1231 Encounter for screening mammogram for malignant neoplasm of breast: Secondary | ICD-10-CM

## 2022-12-24 DIAGNOSIS — Z1159 Encounter for screening for other viral diseases: Secondary | ICD-10-CM

## 2022-12-24 DIAGNOSIS — Z780 Asymptomatic menopausal state: Secondary | ICD-10-CM

## 2022-12-24 DIAGNOSIS — I10 Essential (primary) hypertension: Secondary | ICD-10-CM

## 2022-12-24 DIAGNOSIS — Z23 Encounter for immunization: Secondary | ICD-10-CM | POA: Diagnosis not present

## 2022-12-24 DIAGNOSIS — Z6827 Body mass index (BMI) 27.0-27.9, adult: Secondary | ICD-10-CM

## 2022-12-24 DIAGNOSIS — Z0001 Encounter for general adult medical examination with abnormal findings: Secondary | ICD-10-CM

## 2022-12-24 DIAGNOSIS — R7303 Prediabetes: Secondary | ICD-10-CM

## 2022-12-24 DIAGNOSIS — Z114 Encounter for screening for human immunodeficiency virus [HIV]: Secondary | ICD-10-CM

## 2022-12-24 MED ORDER — AMLODIPINE-OLMESARTAN 5-20 MG PO TABS: 1.0000 | ORAL_TABLET | Freq: Every day | ORAL | 0 refills | Status: DC

## 2022-12-24 NOTE — Progress Notes (Signed)
Subjective:    Patient ID: Jennifer Pace, female    DOB: [birth year], 57 y.o.   MRN: [id number]  HPI  Patient presents to clinic today for her annual exam. Of note, her BP today is 164/96.  She is not taking any antihypertensive medications at this time.  Flu: 09/2022 Tetanus: 01/2012 COVID: Moderna x 3 Shingrix: Never Pap smear: Hysterectomy Mammogram: 05/2022 Bone density: > 5 years Colon screening: 05/2015 Vision screening: annually Dentist: dentures  Diet: She does not eat a lot of meat. She consumes fruits and veggies. She tries to avoid fried foods. She drinks mostly water. Exercise: body weight exericse, spin and walking  Review of Systems  No past medical history on file.  Current Outpatient Medications  Medication Sig Dispense Refill   acetaminophen (TYLENOL) 500 MG tablet Take 500 mg by mouth every 6 (six) hours as needed.     atorvastatin (LIPITOR) 10 MG tablet TAKE 1 TABLET BY MOUTH EVERY DAY 90 tablet 0   naproxen sodium (ALEVE) 220 MG tablet Take 220 mg by mouth daily as needed.     No current facility-administered medications for this visit.    No Known Allergies  Family History  Problem Relation Age of Onset   Kidney failure Mother    Diabetes Father    Heart Problems Father    Breast cancer Sister 57    Social History   Socioeconomic History   Marital status: Married    Spouse name: Not on file   Number of children: Not on file   Years of education: Not on file   Highest education level: Not on file  Occupational History   Not on file  Tobacco Use   Smoking status: Never   Smokeless tobacco: Never  Vaping Use   Vaping Use: Never used  Substance and Sexual Activity   Alcohol use: No   Drug use: No   Sexual activity: Yes    Birth control/protection: Surgical  Other Topics Concern   Not on file  Social History Narrative   Not on file   Social Determinants of Health   Financial Resource Strain: Not on file  Food Insecurity: Not  on file  Transportation Needs: Not on file  Physical Activity: Not on file  Stress: Not on file  Social Connections: Not on file  Intimate Partner Violence: Not on file     Constitutional: Denies fever, malaise, fatigue, headache or abrupt weight changes.  HEENT: Pt reports left eye blurred vision and redness. Denies eye pain, ear pain, ringing in the ears, wax buildup, runny nose, nasal congestion, bloody nose, or sore throat. Respiratory: Denies difficulty breathing, shortness of breath, cough or sputum production.   Cardiovascular: Denies chest pain, chest tightness, palpitations or swelling in the hands or feet.  Gastrointestinal: Denies abdominal pain, bloating, constipation, diarrhea or blood in the stool.  GU: Denies urgency, frequency, pain with urination, burning sensation, blood in urine, odor or discharge. Musculoskeletal: Denies decrease in range of motion, difficulty with gait, muscle pain or joint pain and swelling.  Skin: Denies redness, rashes, lesions or ulcercations.  Neurological: Denies dizziness, difficulty with memory, difficulty with speech or problems with balance and coordination.  Psych: Denies anxiety, depression, SI/HI.  No other specific complaints in a complete review of systems (except as listed in HPI above).     Objective:   Physical Exam   BP (!) 188/103 (BP Location: Left Arm, Patient Position: Sitting, Cuff Size: Normal)   Pulse 80   Temp Marland Kitchen)  96.8 F (36 C) (Temporal)   Wt 180 lb (81.6 kg)   SpO2 100%   BMI 27.37 kg/m   Wt Readings from Last 3 Encounters:  10/12/21 184 lb (83.5 kg)  04/05/21 187 lb 6.4 oz (85 kg)  03/28/21 187 lb (84.8 kg)    General: Appears her stated age, overweight, in NAD. Skin: Warm, dry and intact.  HEENT: Head: normal shape and size; Eyes: sclera white, no icterus, conjunctiva pink, PERRLA and EOMs intact;  Neck:  Neck supple, trachea midline. No masses, lumps or thyromegaly present.  Cardiovascular: Normal  rate and rhythm. S1,S2 noted.  No murmur, rubs or gallops noted. No JVD or BLE edema. No carotid bruits noted. Pulmonary/Chest: Normal effort and positive vesicular breath sounds. No respiratory distress. No wheezes, rales or ronchi noted.  Abdomen: Normal bowel sounds.  Musculoskeletal: Strength 5/5 BUE/BLE.  No difficulty with gait.  Neurological: Alert and oriented. Cranial nerves II-XII grossly intact. Coordination normal.  Psychiatric: Mood and affect normal. Behavior is normal. Judgment and thought content normal.     BMET    Component Value Date/Time   NA 138 01/23/2022 0817   NA 139 03/28/2021 1034   NA 139 07/13/2013 0556   K 4.1 01/23/2022 0817   K 3.5 07/13/2013 0556   CL 104 01/23/2022 0817   CL 107 07/13/2013 0556   CO2 29 01/23/2022 0817   CO2 28 07/13/2013 0556   GLUCOSE 86 01/23/2022 0817   GLUCOSE 113 (H) 07/13/2013 0556   BUN 11 01/23/2022 0817   BUN 13 03/28/2021 1034   BUN 9 07/13/2013 0556   CREATININE 0.89 01/23/2022 0817   CALCIUM 9.2 01/23/2022 0817   CALCIUM 8.6 07/13/2013 0556   GFRNONAA >60 09/30/2020 0333   GFRNONAA >60 07/13/2013 0556   GFRAA >60 07/13/2013 0556    Lipid Panel     Component Value Date/Time   CHOL 178 01/23/2022 0817   CHOL 229 (H) 03/28/2021 1034   TRIG 61 01/23/2022 0817   HDL 71 01/23/2022 0817   HDL 63 03/28/2021 1034   CHOLHDL 2.5 01/23/2022 0817   LDLCALC 92 01/23/2022 0817    CBC    Component Value Date/Time   WBC 8.1 09/30/2020 0333   RBC 4.75 09/30/2020 0333   HGB 12.3 09/30/2020 0333   HGB 12.5 07/13/2013 0556   HCT 38.8 09/30/2020 0333   HCT 38.2 07/13/2013 0556   PLT 280 09/30/2020 0333   PLT 282 07/13/2013 0556   MCV 81.7 09/30/2020 0333   MCV 83 07/13/2013 0556   MCH 25.9 (L) 09/30/2020 0333   MCHC 31.7 09/30/2020 0333   RDW 14.4 09/30/2020 0333   RDW 13.9 07/13/2013 0556    Hgb A1C Lab Results  Component Value Date   HGBA1C 5.7 (H) 10/12/2021           Assessment & Plan:    Preventative Health Maintenance:  Flu shot UTD Tdap today Encouraged her to get her COVID booster Discussed Shingrix vaccine, she will check coverage with her insurance and schedule a nurse visit if she would like to have this done She no longer needs Pap smears Mammogram ordered-she will call to schedule Bone density ordered-she will call to schedule Colon screening UTD Encouraged her to consume a balanced diet and exercise regimen Advised her to see an eye doctor and dentist annually We will check CBC, c-Met, lipid, A1c, HIV and hep C today  RTC in 6 months, follow-up chronic conditions Nicki Reaper, NP

## 2022-12-24 NOTE — Assessment & Plan Note (Signed)
Will start amlodipine-olmesartan 5-20 mg daily Reinforced DASH diet and exercise weight loss C-Met today

## 2022-12-24 NOTE — Assessment & Plan Note (Signed)
Encourage diet and exercise for weight loss 

## 2022-12-25 LAB — COMPLETE METABOLIC PANEL WITH GFR
AG Ratio: 1 (calc) (ref 1.0–2.5)
ALT: 7 U/L (ref 6–29)
AST: 12 U/L (ref 10–35)
Albumin: 4 g/dL (ref 3.6–5.1)
Alkaline phosphatase (APISO): 91 U/L (ref 37–153)
BUN: 11 mg/dL (ref 7–25)
CO2: 28 mmol/L (ref 20–32)
Calcium: 9.7 mg/dL (ref 8.6–10.4)
Chloride: 103 mmol/L (ref 98–110)
Creat: 0.91 mg/dL (ref 0.50–1.03)
Globulin: 4.1 g/dL (calc) — ABNORMAL HIGH (ref 1.9–3.7)
Glucose, Bld: 93 mg/dL (ref 65–99)
Potassium: 4.9 mmol/L (ref 3.5–5.3)
Sodium: 137 mmol/L (ref 135–146)
Total Bilirubin: 0.3 mg/dL (ref 0.2–1.2)
Total Protein: 8.1 g/dL (ref 6.1–8.1)
eGFR: 73 mL/min/{1.73_m2} (ref >=60)

## 2022-12-25 LAB — CBC
HCT: 33.1 % — ABNORMAL LOW (ref 35.0–45.0)
Hemoglobin: 9.9 g/dL — ABNORMAL LOW (ref 11.7–15.5)
MCH: 21.8 pg — ABNORMAL LOW (ref 27.0–33.0)
MCHC: 29.9 g/dL — ABNORMAL LOW (ref 32.0–36.0)
MCV: 72.7 fL — ABNORMAL LOW (ref 80.0–100.0)
MPV: 10.8 fL (ref 7.5–12.5)
Platelets: 418 10*3/uL — ABNORMAL HIGH (ref 140–400)
RBC: 4.55 10*6/uL (ref 3.80–5.10)
RDW: 14.2 % (ref 11.0–15.0)
WBC: 6.2 10*3/uL (ref 3.8–10.8)

## 2022-12-25 LAB — HEPATITIS C ANTIBODY: Hepatitis C Ab: NONREACTIVE

## 2022-12-25 LAB — HEMOGLOBIN A1C
Hgb A1c MFr Bld: 6.1 % of total Hgb — ABNORMAL HIGH (ref <5.7)
Mean Plasma Glucose: 128 mg/dL
eAG (mmol/L): 7.1 mmol/L

## 2022-12-25 LAB — LIPID PANEL
Cholesterol: 258 mg/dL — ABNORMAL HIGH (ref <200)
HDL: 79 mg/dL (ref >=50)
LDL Cholesterol (Calc): 163 mg/dL (calc) — ABNORMAL HIGH
Non-HDL Cholesterol (Calc): 179 mg/dL (calc) — ABNORMAL HIGH (ref <130)
Total CHOL/HDL Ratio: 3.3 (calc) (ref <5.0)
Triglycerides: 64 mg/dL (ref <150)

## 2022-12-25 LAB — HIV ANTIBODY (ROUTINE TESTING W REFLEX): HIV 1&2 Ab, 4th Generation: NONREACTIVE

## 2023-01-07 ENCOUNTER — Ambulatory Visit (INDEPENDENT_AMBULATORY_CARE_PROVIDER_SITE_OTHER): Payer: Managed Care, Other (non HMO) | Admitting: Internal Medicine

## 2023-01-07 ENCOUNTER — Encounter: Payer: Self-pay | Admitting: Internal Medicine

## 2023-01-07 VITALS — BP 140/92 | HR 86 | Temp 96.4°F | Wt 182.0 lb

## 2023-01-07 DIAGNOSIS — D509 Iron deficiency anemia, unspecified: Secondary | ICD-10-CM | POA: Diagnosis not present

## 2023-01-07 DIAGNOSIS — E7800 Pure hypercholesterolemia, unspecified: Secondary | ICD-10-CM

## 2023-01-07 DIAGNOSIS — I10 Essential (primary) hypertension: Secondary | ICD-10-CM | POA: Diagnosis not present

## 2023-01-07 MED ORDER — AMLODIPINE-OLMESARTAN 10-40 MG PO TABS: 1.0000 | ORAL_TABLET | Freq: Every day | ORAL | 0 refills | Status: DC

## 2023-01-07 NOTE — Patient Instructions (Signed)

## 2023-01-07 NOTE — Assessment & Plan Note (Signed)
She does not want to take cholesterol-lowering medication at this time Encourage low saturated fat diet We will plan to recheck lipid profile in 3 months, lab only

## 2023-01-07 NOTE — Assessment & Plan Note (Signed)
Improved but not at goal Will increase amlodipine-olmesartan to 10-40 mg daily Reinforced DASH diet and exercise for weight loss  RTC in 2 weeks for HTN follow-up

## 2023-01-07 NOTE — Progress Notes (Signed)
Subjective:    Patient ID: Jennifer Pace, female    DOB: [birth year], 57 y.o.   MRN: [id number]  HPI  Pt presents to the clinic today for 2 week of HTN. At her last visit, she was started on Amlodipine-Olmesartan. She has been taking the medication as prescribed. Her BP today is 148/92. ECG from 09/2020 reviewed.  She also wants to discuss her recent labs.  Her last LDL was 163, triglycerides 64.  She does not want to take a cholesterol-lowering medication if she does not have to.  She would like to try to work on diet and exercise.  Her H/H was 9.9/34.1.  She does not want to take oral Iron due to concern for constipation but would consider it.  Review of Systems  No past medical history on file.  Current Outpatient Medications  Medication Sig Dispense Refill   acetaminophen (TYLENOL) 500 MG tablet Take 500 mg by mouth every 6 (six) hours as needed. (Patient not taking: Reported on 12/24/2022)     amLODipine-olmesartan (AZOR) 5-20 MG tablet Take 1 tablet by mouth daily. 30 tablet 0   atorvastatin (LIPITOR) 10 MG tablet TAKE 1 TABLET BY MOUTH EVERY DAY (Patient not taking: Reported on 12/24/2022) 90 tablet 0   naproxen sodium (ALEVE) 220 MG tablet Take 220 mg by mouth daily as needed. (Patient not taking: Reported on 12/24/2022)     No current facility-administered medications for this visit.    No Known Allergies  Family History  Problem Relation Age of Onset   Kidney failure Mother    Diabetes Father    Heart Problems Father    Breast cancer Sister 44    Social History   Socioeconomic History   Marital status: Married    Spouse name: Not on file   Number of children: Not on file   Years of education: Not on file   Highest education level: Not on file  Occupational History   Not on file  Tobacco Use   Smoking status: Never   Smokeless tobacco: Never  Vaping Use   Vaping Use: Never used  Substance and Sexual Activity   Alcohol use: No   Drug use: No   Sexual  activity: Yes    Birth control/protection: Surgical  Other Topics Concern   Not on file  Social History Narrative   Not on file   Social Determinants of Health   Financial Resource Strain: Not on file  Food Insecurity: Not on file  Transportation Needs: Not on file  Physical Activity: Not on file  Stress: Not on file  Social Connections: Not on file  Intimate Partner Violence: Not on file     Constitutional: Denies fever, malaise, fatigue, headache or abrupt weight changes.  Respiratory: Denies difficulty breathing, shortness of breath, cough or sputum production.   Cardiovascular: Denies chest pain, chest tightness, palpitations or swelling in the hands or feet.  Neurological: Denies dizziness, difficulty with memory, difficulty with speech or problems with balance and coordination.    No other specific complaints in a complete review of systems (except as listed in HPI above).     Objective:   Physical Exam  BP (!) 140/92 (BP Location: Left Arm, Patient Position: Sitting, Cuff Size: Normal)   Pulse 86   Temp (!) 96.4 F (35.8 C) (Temporal)   Wt 182 lb (82.6 kg)   SpO2 99%   BMI 27.67 kg/m   Wt Readings from Last 3 Encounters:  12/24/22 180 lb (81.6 kg)  10/12/21 184 lb (83.5 kg)  04/05/21 187 lb 6.4 oz (85 kg)    General: Appears her stated age, overweight, in NAD. HEENT: Head: normal shape and size; Eyes: sclera white, no icterus, conjunctiva pink, PERRLA and EOMs intact;  Cardiovascular: Normal rate and rhythm. S1,S2 noted.  No murmur, rubs or gallops noted. No JVD or BLE edema.  Pulmonary/Chest: Normal effort and positive vesicular breath sounds. No respiratory distress. No wheezes, rales or ronchi noted.  Musculoskeletal: No difficulty with gait.  Neurological: Alert and oriented.    BMET    Component Value Date/Time   NA 137 12/24/2022 1024   NA 139 03/28/2021 1034   NA 139 07/13/2013 0556   K 4.9 12/24/2022 1024   K 3.5 07/13/2013 0556   CL 103  12/24/2022 1024   CL 107 07/13/2013 0556   CO2 28 12/24/2022 1024   CO2 28 07/13/2013 0556   GLUCOSE 93 12/24/2022 1024   GLUCOSE 113 (H) 07/13/2013 0556   BUN 11 12/24/2022 1024   BUN 13 03/28/2021 1034   BUN 9 07/13/2013 0556   CREATININE 0.91 12/24/2022 1024   CALCIUM 9.7 12/24/2022 1024   CALCIUM 8.6 07/13/2013 0556   GFRNONAA >60 09/30/2020 0333   GFRNONAA >60 07/13/2013 0556   GFRAA >60 07/13/2013 0556    Lipid Panel     Component Value Date/Time   CHOL 258 (H) 12/24/2022 1024   CHOL 229 (H) 03/28/2021 1034   TRIG 64 12/24/2022 1024   HDL 79 12/24/2022 1024   HDL 63 03/28/2021 1034   CHOLHDL 3.3 12/24/2022 1024   LDLCALC 163 (H) 12/24/2022 1024    CBC    Component Value Date/Time   WBC 6.2 12/24/2022 1024   RBC 4.55 12/24/2022 1024   HGB 9.9 (L) 12/24/2022 1024   HGB 12.5 07/13/2013 0556   HCT 33.1 (L) 12/24/2022 1024   HCT 38.2 07/13/2013 0556   PLT 418 (H) 12/24/2022 1024   PLT 282 07/13/2013 0556   MCV 72.7 (L) 12/24/2022 1024   MCV 83 07/13/2013 0556   MCH 21.8 (L) 12/24/2022 1024   MCHC 29.9 (L) 12/24/2022 1024   RDW 14.2 12/24/2022 1024   RDW 13.9 07/13/2013 0556    Hgb A1C Lab Results  Component Value Date   HGBA1C 6.1 (H) 12/24/2022            Assessment & Plan:     RTC in 2 weeks for HTN follow-up, 6 months for follow-up of chronic conditions Webb Silversmith, NP

## 2023-01-07 NOTE — Assessment & Plan Note (Signed)
She is hesitant to take oral iron but will consider Slow Fe We will plan to repeat CBC and iron panel in 3 months, lab only

## 2023-01-21 ENCOUNTER — Ambulatory Visit: Payer: Managed Care, Other (non HMO) | Admitting: Internal Medicine

## 2023-01-24 ENCOUNTER — Ambulatory Visit: Payer: Managed Care, Other (non HMO) | Admitting: Internal Medicine

## 2023-01-24 ENCOUNTER — Encounter: Payer: Self-pay | Admitting: Internal Medicine

## 2023-01-24 VITALS — BP 124/80 | HR 94 | Temp 96.4°F | Wt 182.0 lb

## 2023-01-24 DIAGNOSIS — R448 Other symptoms and signs involving general sensations and perceptions: Secondary | ICD-10-CM | POA: Diagnosis not present

## 2023-01-24 DIAGNOSIS — Z6827 Body mass index (BMI) 27.0-27.9, adult: Secondary | ICD-10-CM

## 2023-01-24 DIAGNOSIS — I10 Essential (primary) hypertension: Secondary | ICD-10-CM

## 2023-01-24 DIAGNOSIS — E663 Overweight: Secondary | ICD-10-CM

## 2023-01-24 NOTE — Assessment & Plan Note (Signed)
Controlled on amlodipine-olmesartan Reinforced DASH diet and exercise for weight loss

## 2023-01-24 NOTE — Progress Notes (Signed)
Subjective:    Patient ID: Jennifer Pace, female    DOB: September 22, 1966, 57 y.o.   MRN: [id number]  HPI  Patient presents to clinic today for 2-week follow-up of HTN.  At her last visit, her Amlodipine-Olmesartan was increased to 10-40 mg.  She has been taking the medication as prescribed.  Her BP today is 124/80.  ECG from 09/2020 reviewed.  She also reports intermittent sharp pain underneath her left eye.  She reports associated pressure in the left side of the forehead and the back of the left side of the head.  She reports this occurs intermittently.  She denies any vision changes, dizziness, runny nose, nasal congestion, ear pain.  She reports this pain typically resolves without intervention.  She follows with [facility] ophthal[facility] but has not seen them recently.  Review of Systems     No past medical history on file.  Current Outpatient Medications  Medication Sig Dispense Refill   amLODipine-olmesartan (AZOR) 10-40 MG tablet Take 1 tablet by mouth daily. 90 tablet 0   atorvastatin (LIPITOR) 10 MG tablet TAKE 1 TABLET BY MOUTH EVERY DAY (Patient not taking: Reported on 12/24/2022) 90 tablet 0   No current facility-administered medications for this visit.    No Known Allergies  Family History  Problem Relation Age of Onset   Kidney failure Mother    Diabetes Father    Heart Problems Father    Breast cancer Sister 50    Social History   Socioeconomic History   Marital status: Married    Spouse name: Not on file   Number of children: Not on file   Years of education: Not on file   Highest education level: Not on file  Occupational History   Not on file  Tobacco Use   Smoking status: Never   Smokeless tobacco: Never  Vaping Use   Vaping Use: Never used  Substance and Sexual Activity   Alcohol use: No   Drug use: No   Sexual activity: Yes    Birth control/protection: Surgical  Other Topics Concern   Not on file  Social History Narrative   Not on file   Social  Determinants of Health   Financial Resource Strain: Not on file  Food Insecurity: Not on file  Transportation Needs: Not on file  Physical Activity: Not on file  Stress: Not on file  Social Connections: Not on file  Intimate Partner Violence: Not on file     Constitutional: Patient reports headache.  Denies fever, malaise, fatigue, or abrupt weight changes.  HEENT: Patient reports facial pressure.  Denies eye pain, eye redness, ear pain, ringing in the ears, wax buildup, runny nose, nasal congestion, bloody nose, or sore throat. Respiratory: Denies difficulty breathing, shortness of breath, cough or sputum production.   Cardiovascular: Denies chest pain, chest tightness, palpitations or swelling in the hands or feet.  Musculoskeletal: Denies decrease in range of motion, difficulty with gait, muscle pain or joint pain and swelling.  Skin: Denies redness, rashes, lesions or ulcercations.  Neurological: Denies dizziness, difficulty with memory, difficulty with speech or problems with balance and coordination.   No other specific complaints in a complete review of systems (except as listed in HPI above).  Objective:   Physical Exam  BP 124/80 (BP Location: Left Arm, Patient Position: Sitting, Cuff Size: Normal)   Pulse 94   Temp (!) 96.4 F (35.8 C) (Temporal)   Wt 182 lb (82.6 kg)   SpO2 100%   BMI 27.67 kg/m  Wt Readings from Last 3 Encounters:  01/07/23 182 lb (82.6 kg)  12/24/22 180 lb (81.6 kg)  10/12/21 184 lb (83.5 kg)    General: Appears her stated age, overweight, in NAD. Skin: Warm, dry and intact.  HEENT: Head: normal shape and size, no pain with palpation of the left frontal or maxillary sinus; Eyes: sclera white, no icterus, conjunctiva pink, PERRLA and EOMs intact;  Cardiovascular: Normal rate and rhythm. S1,S2 noted.  No murmur, rubs or gallops noted. No JVD or BLE edema.  Pulmonary/Chest: Normal effort and positive vesicular breath sounds. No respiratory  distress. No wheezes, rales or ronchi noted.  Musculoskeletal: No pain with palpation of the TMJ.  No difficulty with gait.  Neurological: Alert and oriented. Coordination normal.     BMET    Component Value Date/Time   NA 137 12/24/2022 1024   NA 139 03/28/2021 1034   NA 139 07/13/2013 0556   K 4.9 12/24/2022 1024   K 3.5 07/13/2013 0556   CL 103 12/24/2022 1024   CL 107 07/13/2013 0556   CO2 28 12/24/2022 1024   CO2 28 07/13/2013 0556   GLUCOSE 93 12/24/2022 1024   GLUCOSE 113 (H) 07/13/2013 0556   BUN 11 12/24/2022 1024   BUN 13 03/28/2021 1034   BUN 9 07/13/2013 0556   CREATININE 0.91 12/24/2022 1024   CALCIUM 9.7 12/24/2022 1024   CALCIUM 8.6 07/13/2013 0556   GFRNONAA >60 09/30/2020 0333   GFRNONAA >60 07/13/2013 0556   GFRAA >60 07/13/2013 0556    Lipid Panel     Component Value Date/Time   CHOL 258 (H) 12/24/2022 1024   CHOL 229 (H) 03/28/2021 1034   TRIG 64 12/24/2022 1024   HDL 79 12/24/2022 1024   HDL 63 03/28/2021 1034   CHOLHDL 3.3 12/24/2022 1024   LDLCALC 163 (H) 12/24/2022 1024    CBC    Component Value Date/Time   WBC 6.2 12/24/2022 1024   RBC 4.55 12/24/2022 1024   HGB 9.9 (L) 12/24/2022 1024   HGB 12.5 07/13/2013 0556   HCT 33.1 (L) 12/24/2022 1024   HCT 38.2 07/13/2013 0556   PLT 418 (H) 12/24/2022 1024   PLT 282 07/13/2013 0556   MCV 72.7 (L) 12/24/2022 1024   MCV 83 07/13/2013 0556   MCH 21.8 (L) 12/24/2022 1024   MCHC 29.9 (L) 12/24/2022 1024   RDW 14.2 12/24/2022 1024   RDW 13.9 07/13/2013 0556    Hgb A1C Lab Results  Component Value Date   HGBA1C 6.1 (H) 12/24/2022          Assessment & Plan:   Facial Pressure:  Sinus versus eye problem versus ocular migraine No indication of sinus infection on exam Advised her to follow-up with her ophthalmologist, if he does not think this is eye related to let me know  RTC in 5 months for follow-up of chronic conditions Webb Silversmith, NP

## 2023-01-24 NOTE — Assessment & Plan Note (Signed)
Encourage diet and exercise for weight loss 

## 2023-01-24 NOTE — Patient Instructions (Signed)

## 2023-04-05 ENCOUNTER — Other Ambulatory Visit: Payer: Self-pay | Admitting: Internal Medicine

## 2023-04-05 NOTE — Telephone Encounter (Signed)
Requested Prescriptions  Pending Prescriptions Disp Refills   amLODipine-olmesartan (AZOR) 10-40 MG tablet [Pharmacy Med Name: AMLODIPINE-OLMESARTAN 10-40 MG] 90 tablet 0    Sig: TAKE 1 TABLET BY MOUTH EVERY DAY     Cardiovascular: CCB + ARB Combos Passed - 04/05/2023  1:42 AM      Passed - K in normal range and within 180 days    Potassium  Date Value Ref Range Status  12/24/2022 4.9 3.5 - 5.3 mmol/L Final  07/13/2013 3.5 3.5 - 5.1 mmol/L Final         Passed - Cr in normal range and within 180 days    Creat  Date Value Ref Range Status  12/24/2022 0.91 0.50 - 1.03 mg/dL Final         Passed - Na in normal range and within 180 days    Sodium  Date Value Ref Range Status  12/24/2022 137 135 - 146 mmol/L Final  03/28/2021 139 134 - 144 mmol/L Final  07/13/2013 139 136 - 145 mmol/L Final         Passed - Patient is not pregnant      Passed - Last BP in normal range    BP Readings from Last 1 Encounters:  01/24/23 124/80         Passed - Valid encounter within last 6 months    Recent Outpatient Visits           2 months ago Primary hypertension   [facility], Salvadore Oxford, NP   2 months ago Primary hypertension   [facility] [facility] [place], Salvadore Oxford, NP   3 months ago Encounter for general adult medical examination with abnormal findings   [place] [facility] [place], Salvadore Oxford, NP   1 year ago Pure hypercholesterolemia   [place] [facility] [place], Salvadore Oxford, NP       Future Appointments             In 3 months Baity, Salvadore Oxford, NP [place] [facility]

## 2023-06-03 ENCOUNTER — Ambulatory Visit
Admission: RE | Admit: 2023-06-03 | Discharge: 2023-06-03 | Disposition: A | Discharge status: Home / Self Care | Payer: Managed Care, Other (non HMO) | Source: Ambulatory Visit | Attending: Internal Medicine | Admitting: Internal Medicine

## 2023-06-03 DIAGNOSIS — Z1231 Encounter for screening mammogram for malignant neoplasm of breast: Secondary | ICD-10-CM | POA: Insufficient documentation

## 2023-06-03 DIAGNOSIS — Z780 Asymptomatic menopausal state: Secondary | ICD-10-CM | POA: Insufficient documentation

## 2023-07-09 ENCOUNTER — Encounter: Payer: Self-pay | Admitting: Internal Medicine

## 2023-07-09 ENCOUNTER — Ambulatory Visit (INDEPENDENT_AMBULATORY_CARE_PROVIDER_SITE_OTHER): Payer: Managed Care, Other (non HMO) | Admitting: Internal Medicine

## 2023-07-09 VITALS — BP 138/84 | HR 79 | Ht 68.0 in | Wt 176.0 lb

## 2023-07-09 DIAGNOSIS — Z6826 Body mass index (BMI) 26.0-26.9, adult: Secondary | ICD-10-CM

## 2023-07-09 DIAGNOSIS — R6882 Decreased libido: Secondary | ICD-10-CM | POA: Insufficient documentation

## 2023-07-09 DIAGNOSIS — I10 Essential (primary) hypertension: Secondary | ICD-10-CM

## 2023-07-09 DIAGNOSIS — R7303 Prediabetes: Secondary | ICD-10-CM | POA: Diagnosis not present

## 2023-07-09 DIAGNOSIS — N951 Menopausal and female climacteric states: Secondary | ICD-10-CM

## 2023-07-09 DIAGNOSIS — D75839 Thrombocytosis, unspecified: Secondary | ICD-10-CM | POA: Insufficient documentation

## 2023-07-09 DIAGNOSIS — E663 Overweight: Secondary | ICD-10-CM

## 2023-07-09 DIAGNOSIS — M8580 Other specified disorders of bone density and structure, unspecified site: Secondary | ICD-10-CM | POA: Insufficient documentation

## 2023-07-09 DIAGNOSIS — D508 Other iron deficiency anemias: Secondary | ICD-10-CM

## 2023-07-09 DIAGNOSIS — E7800 Pure hypercholesterolemia, unspecified: Secondary | ICD-10-CM

## 2023-07-09 DIAGNOSIS — M8589 Other specified disorders of bone density and structure, multiple sites: Secondary | ICD-10-CM

## 2023-07-09 LAB — CBC WITH DIFFERENTIAL/PLATELET
Absolute Monocytes: 593 cells/uL (ref 200–950)
Basophils Relative: 0.9 %
Eosinophils Absolute: 131 cells/uL (ref 15–500)
MCH: 23.6 pg — ABNORMAL LOW (ref 27.0–33.0)
MCHC: 30.6 g/dL — ABNORMAL LOW (ref 32.0–36.0)
MCV: 77.3 fL — ABNORMAL LOW (ref 80.0–100.0)
MPV: 11 fL (ref 7.5–12.5)
Platelets: 349 10*3/uL (ref 140–400)
Total Lymphocyte: 26.7 %
WBC: 6.9 10*3/uL (ref 3.8–10.8)

## 2023-07-09 NOTE — Assessment & Plan Note (Signed)
Encourage diet and exercise for weight loss 

## 2023-07-09 NOTE — Assessment & Plan Note (Signed)
A1c today Encourage low-carb diet and exercise for weight loss 

## 2023-07-09 NOTE — Assessment & Plan Note (Signed)
Continue calcium and vitamin D OTC Encouraged daily weightbearing exercise 

## 2023-07-09 NOTE — Assessment & Plan Note (Signed)
CBC today.  

## 2023-07-09 NOTE — Assessment & Plan Note (Signed)
Discussed BP goal of 130/80 Continue amlodipine-olmesartan at current dose Reinforced DASH diet and exercise for weight loss C-Met today

## 2023-07-09 NOTE — Assessment & Plan Note (Signed)
Not medicated We will monitor 

## 2023-07-09 NOTE — Assessment & Plan Note (Signed)
Advised her that there is no treatment I can provide her to increase her libido She is not necessarily complaining of postmenopausal vaginal atrophy Discussed ADDYI, however this has to be prescribed by GYN

## 2023-07-09 NOTE — Patient Instructions (Signed)

## 2023-07-09 NOTE — Progress Notes (Signed)
Subjective:    Patient ID: Jennifer Pace, female    DOB: 1966/03/17, 57 y.o.   MRN: [id number]  HPI  Patient presents the clinic today for 39-month follow-up of chronic conditions.  HTN: Her BP today is 138/84.  She is taking amlodipine-olmesartan as prescribed.  ECG from 09/2020 reviewed.  Postmenopausal hot flashes: She is not currently taking any medications for this.  HLD: Her last LDL was 163, triglycerides 64, 12/2019.  She is not taking atorvastatin as prescribed.  She tries to consume a low-fat diet.  Prediabetes: Her last A1c was 6.1%, 12/2022.  She is not taking any oral diabetic medication at this time.  She does not check her sugars.  Osteopenia: She is not taking any calcium and vitamin D OTC.  She does get daily weightbearing exercise.  Bone density from 05/2023 reviewed.  Anemia: Her last H/H was 9.9/33.1, 12/2022.  She reports she does not eat a lot of red meat.  She is not taking any oral iron at this time.  She does not follow with hematology.  Thrombocytosis: Her last platelet count was 418, 12/2022.  She does not follow with hematology.  Review of Systems  No past medical history on file.  Current Outpatient Medications  Medication Sig Dispense Refill   amLODipine-olmesartan (AZOR) 10-40 MG tablet TAKE 1 TABLET BY MOUTH EVERY DAY 90 tablet 0   atorvastatin (LIPITOR) 10 MG tablet TAKE 1 TABLET BY MOUTH EVERY DAY (Patient not taking: Reported on 12/24/2022) 90 tablet 0   No current facility-administered medications for this visit.    No Known Allergies  Family History  Problem Relation Age of Onset   Kidney failure Mother    Diabetes Father    Heart Problems Father    Breast cancer Sister 44    Social History   Socioeconomic History   Marital status: Married    Spouse name: Not on file   Number of children: Not on file   Years of education: Not on file   Highest education level: Not on file  Occupational History   Not on file  Tobacco Use    Smoking status: Never   Smokeless tobacco: Never  Vaping Use   Vaping status: Never Used  Substance and Sexual Activity   Alcohol use: No   Drug use: No   Sexual activity: Yes    Birth control/protection: Surgical  Other Topics Concern   Not on file  Social History Narrative   Not on file   Social Determinants of Health   Financial Resource Strain: Not on file  Food Insecurity: Not on file  Transportation Needs: Not on file  Physical Activity: Not on file  Stress: Not on file  Social Connections: Unknown (04/27/2022)   Received from [facility]   Social Network    Social Network: Not on file  Intimate Partner Violence: Unknown (03/20/2022)   Received from [facility], [facility]   HITS    Physically Hurt: Not on file    Insult or Talk Down To: Not on file    Threaten Physical Harm: Not on file    Scream or Curse: Not on file     Constitutional: Denies fever, malaise, fatigue, headache or abrupt weight changes.  HEENT: Denies eye pain, eye redness, ear pain, ringing in the ears, wax buildup, runny nose, nasal congestion, bloody nose, or sore throat. Respiratory: Denies difficulty breathing, shortness of breath, cough or sputum production.   Cardiovascular: Denies chest pain, chest tightness,  palpitations or swelling in the hands or feet.  Gastrointestinal: Denies abdominal pain, bloating, constipation, diarrhea or blood in the stool.  GU: Patient reports decreased libido.  Denies urgency, frequency, pain with urination, burning sensation, blood in urine, odor or discharge. Musculoskeletal: Denies decrease in range of motion, difficulty with gait, muscle pain or joint pain and swelling.  Skin: Denies redness, rashes, lesions or ulcercations.  Neurological: Patient reports hot flashes.  Denies dizziness, difficulty with memory, difficulty with speech or problems with balance and coordination.  Psych: Denies anxiety, depression, SI/HI.  No other specific  complaints in a complete review of systems (except as listed in HPI above).     Objective:   Physical Exam   BP 138/84   Pulse 79   Ht 5\' 8"  (1.727 m)   Wt 176 lb (79.8 kg)   SpO2 95%   BMI 26.76 kg/m   Wt Readings from Last 3 Encounters:  01/24/23 182 lb (82.6 kg)  01/07/23 182 lb (82.6 kg)  12/24/22 180 lb (81.6 kg)    General: Appears her stated age, overweight, in NAD. Skin: Warm, dry and intact.  HEENT: Head: normal shape and size; Eyes: sclera white, no icterus, conjunctiva pink, PERRLA and EOMs intact;  Cardiovascular: Normal rate and rhythm. S1,S2 noted.  No murmur, rubs or gallops noted. No JVD or BLE edema. No carotid bruits noted. Pulmonary/Chest: Normal effort and positive vesicular breath sounds. No respiratory distress. No wheezes, rales or ronchi noted.  Musculoskeletal:  No difficulty with gait.  Neurological: Alert and oriented. Coordination normal.  Psychiatric: Mood and affect normal. Behavior is normal. Judgment and thought content normal.    BMET    Component Value Date/Time   NA 137 12/24/2022 1024   NA 139 03/28/2021 1034   NA 139 07/13/2013 0556   K 4.9 12/24/2022 1024   K 3.5 07/13/2013 0556   CL 103 12/24/2022 1024   CL 107 07/13/2013 0556   CO2 28 12/24/2022 1024   CO2 28 07/13/2013 0556   GLUCOSE 93 12/24/2022 1024   GLUCOSE 113 (H) 07/13/2013 0556   BUN 11 12/24/2022 1024   BUN 13 03/28/2021 1034   BUN 9 07/13/2013 0556   CREATININE 0.91 12/24/2022 1024   CALCIUM 9.7 12/24/2022 1024   CALCIUM 8.6 07/13/2013 0556   GFRNONAA >60 09/30/2020 0333   GFRNONAA >60 07/13/2013 0556   GFRAA >60 07/13/2013 0556    Lipid Panel     Component Value Date/Time   CHOL 258 (H) 12/24/2022 1024   CHOL 229 (H) 03/28/2021 1034   TRIG 64 12/24/2022 1024   HDL 79 12/24/2022 1024   HDL 63 03/28/2021 1034   CHOLHDL 3.3 12/24/2022 1024   LDLCALC 163 (H) 12/24/2022 1024    CBC    Component Value Date/Time   WBC 6.2 12/24/2022 1024   RBC 4.55  12/24/2022 1024   HGB 9.9 (L) 12/24/2022 1024   HGB 12.5 07/13/2013 0556   HCT 33.1 (L) 12/24/2022 1024   HCT 38.2 07/13/2013 0556   PLT 418 (H) 12/24/2022 1024   PLT 282 07/13/2013 0556   MCV 72.7 (L) 12/24/2022 1024   MCV 83 07/13/2013 0556   MCH 21.8 (L) 12/24/2022 1024   MCHC 29.9 (L) 12/24/2022 1024   RDW 14.2 12/24/2022 1024   RDW 13.9 07/13/2013 0556    Hgb A1C Lab Results  Component Value Date   HGBA1C 6.1 (H) 12/24/2022           Assessment & Plan:  RTC in 6 months, for your annual exam Nicki Reaper, NP

## 2023-07-09 NOTE — Assessment & Plan Note (Signed)
C-Met and lipid profile today Encouraged her to consume a low-fat diet Discussed LDL goal of less than 100, may need to start statin therapy based on labs

## 2023-07-09 NOTE — Assessment & Plan Note (Signed)
CBC and iron panel today She has not tolerated iron pills in the past

## 2023-07-10 ENCOUNTER — Encounter: Payer: Self-pay | Admitting: Internal Medicine

## 2023-07-10 DIAGNOSIS — E7800 Pure hypercholesterolemia, unspecified: Secondary | ICD-10-CM

## 2023-07-10 LAB — CBC WITH DIFFERENTIAL/PLATELET
Basophils Absolute: 62 cells/uL (ref 0–200)
Eosinophils Relative: 1.9 %
HCT: 35 % (ref 35.0–45.0)
Hemoglobin: 10.7 g/dL — ABNORMAL LOW (ref 11.7–15.5)
Lymphs Abs: 1842 cells/uL (ref 850–3900)
Monocytes Relative: 8.6 %
Neutro Abs: 4271 cells/uL (ref 1500–7800)
Neutrophils Relative %: 61.9 %
RBC: 4.53 10*6/uL (ref 3.80–5.10)
RDW: 15.6 % — ABNORMAL HIGH (ref 11.0–15.0)

## 2023-07-10 LAB — COMPLETE METABOLIC PANEL WITH GFR
AG Ratio: 1.2 (calc) (ref 1.0–2.5)
ALT: 7 U/L (ref 6–29)
AST: 14 U/L (ref 10–35)
Albumin: 4 g/dL (ref 3.6–5.1)
Alkaline phosphatase (APISO): 83 U/L (ref 37–153)
BUN: 14 mg/dL (ref 7–25)
CO2: 28 mmol/L (ref 20–32)
Calcium: 9.8 mg/dL (ref 8.6–10.4)
Chloride: 106 mmol/L (ref 98–110)
Creat: 0.99 mg/dL (ref 0.50–1.03)
Globulin: 3.4 g/dL (calc) (ref 1.9–3.7)
Glucose, Bld: 93 mg/dL (ref 65–139)
Potassium: 4.5 mmol/L (ref 3.5–5.3)
Sodium: 141 mmol/L (ref 135–146)
Total Bilirubin: 0.3 mg/dL (ref 0.2–1.2)
Total Protein: 7.4 g/dL (ref 6.1–8.1)
eGFR: 66 mL/min/{1.73_m2} (ref >=60)

## 2023-07-10 LAB — IRON,TIBC AND FERRITIN PANEL
%SAT: 8 % (calc) — ABNORMAL LOW (ref 16–45)
Ferritin: 11 ng/mL — ABNORMAL LOW (ref 16–232)
Iron: 33 ug/dL — ABNORMAL LOW (ref 45–160)
TIBC: 408 mcg/dL (calc) (ref 250–450)

## 2023-07-10 LAB — HEMOGLOBIN A1C
Hgb A1c MFr Bld: 6 % of total Hgb — ABNORMAL HIGH (ref <5.7)
Mean Plasma Glucose: 126 mg/dL
eAG (mmol/L): 7 mmol/L

## 2023-07-10 LAB — LIPID PANEL
Cholesterol: 236 mg/dL — ABNORMAL HIGH (ref <200)
HDL: 67 mg/dL (ref >=50)
LDL Cholesterol (Calc): 153 mg/dL (calc) — ABNORMAL HIGH
Non-HDL Cholesterol (Calc): 169 mg/dL (calc) — ABNORMAL HIGH (ref <130)
Total CHOL/HDL Ratio: 3.5 (calc) (ref <5.0)
Triglycerides: 61 mg/dL (ref <150)

## 2023-07-10 MED ORDER — ATORVASTATIN CALCIUM 10 MG PO TABS
10.0000 mg | ORAL_TABLET | Freq: Every day | ORAL | 1 refills | Status: DC
Start: 2023-07-10 — End: 2024-01-06

## 2023-11-08 ENCOUNTER — Ambulatory Visit: Payer: Managed Care, Other (non HMO) | Admitting: Family Medicine

## 2023-12-31 ENCOUNTER — Ambulatory Visit: Payer: Managed Care, Other (non HMO) | Admitting: Internal Medicine

## 2023-12-31 ENCOUNTER — Encounter: Payer: Self-pay | Admitting: Internal Medicine

## 2023-12-31 VITALS — BP 138/82 | Ht 68.0 in | Wt 179.8 lb

## 2023-12-31 DIAGNOSIS — E663 Overweight: Secondary | ICD-10-CM | POA: Diagnosis not present

## 2023-12-31 DIAGNOSIS — Z0001 Encounter for general adult medical examination with abnormal findings: Secondary | ICD-10-CM | POA: Diagnosis not present

## 2023-12-31 DIAGNOSIS — R7303 Prediabetes: Secondary | ICD-10-CM | POA: Diagnosis not present

## 2023-12-31 DIAGNOSIS — E7800 Pure hypercholesterolemia, unspecified: Secondary | ICD-10-CM | POA: Diagnosis not present

## 2023-12-31 DIAGNOSIS — Z6827 Body mass index (BMI) 27.0-27.9, adult: Secondary | ICD-10-CM

## 2023-12-31 MED ORDER — AMLODIPINE-OLMESARTAN 10-40 MG PO TABS: 1.0000 | ORAL_TABLET | Freq: Every day | ORAL | 1 refills | Status: DC

## 2023-12-31 NOTE — Assessment & Plan Note (Signed)
 Encourage diet and exercise for weight loss

## 2023-12-31 NOTE — Progress Notes (Signed)
 Subjective:    Patient ID: Jennifer Pace, female    DOB: 1966-03-30, 58 y.o.   MRN: [id number]  HPI  Patient presents to clinic today for her annual exam.   Flu: 09/2023 Tetanus: 12/2022 COVID: Moderna x 3 Shingrix: Never Pap smear: Hysterectomy Mammogram: 05/2023 Bone density: 05/2023 Colon screening: 05/2015 Vision screening: annually Dentist: dentures  Diet: She does not eat a lot of meat. She consumes fruits and veggies. She tries to avoid fried foods. She drinks mostly water. Exercise: body weight exericse, spin and walking  Review of Systems  No past medical history on file.  Current Outpatient Medications  Medication Sig Dispense Refill   amLODipine -olmesartan  (AZOR ) 10-40 MG tablet TAKE 1 TABLET BY MOUTH EVERY DAY 90 tablet 0   atorvastatin  (LIPITOR) 10 MG tablet Take 1 tablet (10 mg total) by mouth daily. 90 tablet 1   Calcium  Carb-Cholecalciferol (CALCIUM  + VITAMIN D3 PO) Take by mouth daily at 6 (six) AM.     No current facility-administered medications for this visit.    No Known Allergies  Family History  Problem Relation Age of Onset   Kidney failure Mother    Diabetes Father    Heart Problems Father    Breast cancer Sister 26    Social History   Socioeconomic History   Marital status: Married    Spouse name: Not on file   Number of children: Not on file   Years of education: Not on file   Highest education level: Not on file  Occupational History   Not on file  Tobacco Use   Smoking status: Never   Smokeless tobacco: Never  Vaping Use   Vaping status: Never Used  Substance and Sexual Activity   Alcohol use: No   Drug use: No   Sexual activity: Yes    Birth control/protection: Surgical  Other Topics Concern   Not on file  Social History Narrative   Not on file   Social Drivers of Health   Financial Resource Strain: Not on file  Food Insecurity: Not on file  Transportation Needs: Not on file  Physical Activity: Not on file   Stress: Not on file  Social Connections: Unknown (04/27/2022)   Received from [facility], [facility]   Social Network    Social Network: Not on file  Intimate Partner Violence: Unknown (03/20/2022)   Received from [facility], [facility]   HITS    Physically Hurt: Not on file    Insult or Talk Down To: Not on file    Threaten Physical Harm: Not on file    Scream or Curse: Not on file     Constitutional: Denies fever, malaise, fatigue, headache or abrupt weight changes.  HEENT: Denies eye pain, ear pain, ringing in the ears, wax buildup, runny nose, nasal congestion, bloody nose, or sore throat. Respiratory: Denies difficulty breathing, shortness of breath, cough or sputum production.   Cardiovascular: Denies chest pain, chest tightness, palpitations or swelling in the hands or feet.  Gastrointestinal: Denies abdominal pain, bloating, constipation, diarrhea or blood in the stool.  GU: Denies urgency, frequency, pain with urination, burning sensation, blood in urine, odor or discharge. Musculoskeletal: Denies decrease in range of motion, difficulty with gait, muscle pain or joint pain and swelling.  Skin: Denies redness, rashes, lesions or ulcercations.  Neurological: Pt reports hot flashes. Denies dizziness, difficulty with memory, difficulty with speech or problems with balance and coordination.  Psych: Denies anxiety, depression, SI/HI.  No other specific complaints  in a complete review of systems (except as listed in HPI above).     Objective:   Physical Exam   BP 138/82 (BP Location: Left Arm, Patient Position: Sitting, Cuff Size: Normal)   Ht 5' 8 (1.727 m)   Wt 179 lb 12.8 oz (81.6 kg)   BMI 27.34 kg/m    Wt Readings from Last 3 Encounters:  07/09/23 176 lb (79.8 kg)  01/24/23 182 lb (82.6 kg)  01/07/23 182 lb (82.6 kg)    General: Appears her stated age, overweight, in NAD. Skin: Warm, dry and intact.  HEENT: Head: normal shape and size; Eyes: sclera  white, no icterus, conjunctiva pink, PERRLA and EOMs intact;  Neck:  Neck supple, trachea midline. No masses, lumps or thyromegaly present.  Cardiovascular: Normal rate and rhythm. S1,S2 noted.  No murmur, rubs or gallops noted. No JVD or BLE edema. No carotid bruits noted. Pulmonary/Chest: Normal effort and positive vesicular breath sounds. No respiratory distress. No wheezes, rales or ronchi noted.  Abdomen: Normal bowel sounds.  Musculoskeletal: Strength 5/5 BUE/BLE.  No difficulty with gait.  Neurological: Alert and oriented. Cranial nerves II-XII grossly intact. Coordination normal.  Psychiatric: Mood and affect normal. Behavior is normal. Judgment and thought content normal.     BMET    Component Value Date/Time   NA 141 07/09/2023 0820   NA 139 03/28/2021 1034   NA 139 07/13/2013 0556   K 4.5 07/09/2023 0820   K 3.5 07/13/2013 0556   CL 106 07/09/2023 0820   CL 107 07/13/2013 0556   CO2 28 07/09/2023 0820   CO2 28 07/13/2013 0556   GLUCOSE 93 07/09/2023 0820   GLUCOSE 113 (H) 07/13/2013 0556   BUN 14 07/09/2023 0820   BUN 13 03/28/2021 1034   BUN 9 07/13/2013 0556   CREATININE 0.99 07/09/2023 0820   CALCIUM  9.8 07/09/2023 0820   CALCIUM  8.6 07/13/2013 0556   GFRNONAA >60 09/30/2020 0333   GFRNONAA >60 07/13/2013 0556   GFRAA >60 07/13/2013 0556    Lipid Panel     Component Value Date/Time   CHOL 236 (H) 07/09/2023 0820   CHOL 229 (H) 03/28/2021 1034   TRIG 61 07/09/2023 0820   HDL 67 07/09/2023 0820   HDL 63 03/28/2021 1034   CHOLHDL 3.5 07/09/2023 0820   LDLCALC 153 (H) 07/09/2023 0820    CBC    Component Value Date/Time   WBC 6.9 07/09/2023 0820   RBC 4.53 07/09/2023 0820   HGB 10.7 (L) 07/09/2023 0820   HGB 12.5 07/13/2013 0556   HCT 35.0 07/09/2023 0820   HCT 38.2 07/13/2013 0556   PLT 349 07/09/2023 0820   PLT 282 07/13/2013 0556   MCV 77.3 (L) 07/09/2023 0820   MCV 83 07/13/2013 0556   MCH 23.6 (L) 07/09/2023 0820   MCHC 30.6 (L) 07/09/2023  0820   RDW 15.6 (H) 07/09/2023 0820   RDW 13.9 07/13/2013 0556   LYMPHSABS 1,842 07/09/2023 0820   EOSABS 131 07/09/2023 0820   BASOSABS 62 07/09/2023 0820    Hgb A1C Lab Results  Component Value Date   HGBA1C 6.0 (H) 07/09/2023           Assessment & Plan:   Preventative Health Maintenance:  Flu shot UTD Tdap UTD Encouraged her to get her COVID booster Discussed Shingrix vaccine, she will check coverage with her insurance and schedule a nurse visit if she would like to have this done She no longer needs Pap smears Mammogram UTD Bone  density UTD Colon screening UTD Encouraged her to consume a balanced diet and exercise regimen Advised her to see an eye doctor and dentist annually We will check CBC, c-Met, lipid, A1c today  RTC in 6 months, follow-up chronic conditions Angeline Laura, NP

## 2023-12-31 NOTE — Patient Instructions (Signed)
 Health Maintenance for Postmenopausal Women Menopause is a normal process in which your ability to get pregnant comes to an end. This process happens slowly over many months or years, usually between the ages of 76 and 38. Menopause is complete when you have missed your menstrual period for 12 months. It is important to talk with your health care provider about some of the most common conditions that affect women after menopause (postmenopausal women). These include heart disease, cancer, and bone loss (osteoporosis). Adopting a healthy lifestyle and getting preventive care can help to promote your health and wellness. The actions you take can also lower your chances of developing some of these common conditions. What are the signs and symptoms of menopause? During menopause, you may have the following symptoms: Hot flashes. These can be moderate or severe. Night sweats. Decrease in sex drive. Mood swings. Headaches. Tiredness (fatigue). Irritability. Memory problems. Problems falling asleep or staying asleep. Talk with your health care provider about treatment options for your symptoms. Do I need hormone replacement therapy? Hormone replacement therapy is effective in treating symptoms that are caused by menopause, such as hot flashes and night sweats. Hormone replacement carries certain risks, especially as you become older. If you are thinking about using estrogen or estrogen with progestin, discuss the benefits and risks with your health care provider. How can I reduce my risk for heart disease and stroke? The risk of heart disease, heart attack, and stroke increases as you age. One of the causes may be a change in the body's hormones during menopause. This can affect how your body uses dietary fats, triglycerides, and cholesterol. Heart attack and stroke are medical emergencies. There are many things that you can do to help prevent heart disease and stroke. Watch your blood pressure High  blood pressure causes heart disease and increases the risk of stroke. This is more likely to develop in people who have high blood pressure readings or are overweight. Have your blood pressure checked: Every 3-5 years if you are 32-23 years of age. Every year if you are 31 years old or older. Eat a healthy diet  Eat a diet that includes plenty of vegetables, fruits, low-fat dairy products, and lean protein. Do not eat a lot of foods that are high in solid fats, added sugars, or sodium. Get regular exercise Get regular exercise. This is one of the most important things you can do for your health. Most adults should: Try to exercise for at least 150 minutes each week. The exercise should increase your heart rate and make you sweat (moderate-intensity exercise). Try to do strengthening exercises at least twice each week. Do these in addition to the moderate-intensity exercise. Spend less time sitting. Even light physical activity can be beneficial. Other tips Work with your health care provider to achieve or maintain a healthy weight. Do not use any products that contain nicotine or tobacco. These products include cigarettes, chewing tobacco, and vaping devices, such as e-cigarettes. If you need help quitting, ask your health care provider. Know your numbers. Ask your health care provider to check your cholesterol and your blood sugar (glucose). Continue to have your blood tested as directed by your health care provider. Do I need screening for cancer? Depending on your health history and family history, you may need to have cancer screenings at different stages of your life. This may include screening for: Breast cancer. Cervical cancer. Lung cancer. Colorectal cancer. What is my risk for osteoporosis? After menopause, you may be  at increased risk for osteoporosis. Osteoporosis is a condition in which bone destruction happens more quickly than new bone creation. To help prevent osteoporosis or  the bone fractures that can happen because of osteoporosis, you may take the following actions: If you are 24-54 years old, get at least 1,000 mg of calcium and at least 600 international units (IU) of vitamin D  per day. If you are older than age 75 but younger than age 30, get at least 1,200 mg of calcium and at least 600 international units (IU) of vitamin D  per day. If you are older than age 8, get at least 1,200 mg of calcium and at least 800 international units (IU) of vitamin D  per day. Smoking and drinking excessive alcohol increase the risk of osteoporosis. Eat foods that are rich in calcium and vitamin D , and do weight-bearing exercises several times each week as directed by your health care provider. How does menopause affect my mental health? Depression may occur at any age, but it is more common as you become older. Common symptoms of depression include: Feeling depressed. Changes in sleep patterns. Changes in appetite or eating patterns. Feeling an overall lack of motivation or enjoyment of activities that you previously enjoyed. Frequent crying spells. Talk with your health care provider if you think that you are experiencing any of these symptoms. General instructions See your health care provider for regular wellness exams and vaccines. This may include: Scheduling regular health, dental, and eye exams. Getting and maintaining your vaccines. These include: Influenza vaccine. Get this vaccine each year before the flu season begins. Pneumonia vaccine. Shingles vaccine. Tetanus, diphtheria, and pertussis (Tdap) booster vaccine. Your health care provider may also recommend other immunizations. Tell your health care provider if you have ever been abused or do not feel safe at home. Summary Menopause is a normal process in which your ability to get pregnant comes to an end. This condition causes hot flashes, night sweats, decreased interest in sex, mood swings, headaches, or lack  of sleep. Treatment for this condition may include hormone replacement therapy. Take actions to keep yourself healthy, including exercising regularly, eating a healthy diet, watching your weight, and checking your blood pressure and blood sugar levels. Get screened for cancer and depression. Make sure that you are up to date with all your vaccines. This information is not intended to replace advice given to you by your health care provider. Make sure you discuss any questions you have with your health care provider. Document Revised: 04/24/2021 Document Reviewed: 04/24/2021 Elsevier Patient Education  2024 [employer].

## 2024-01-01 LAB — COMPLETE METABOLIC PANEL WITH GFR
AG Ratio: 1.2 (calc) (ref 1.0–2.5)
ALT: 10 U/L (ref 6–29)
AST: 15 U/L (ref 10–35)
Albumin: 4 g/dL (ref 3.6–5.1)
Alkaline phosphatase (APISO): 98 U/L (ref 37–153)
BUN: 17 mg/dL (ref 7–25)
CO2: 28 mmol/L (ref 20–32)
Calcium: 9.6 mg/dL (ref 8.6–10.4)
Chloride: 103 mmol/L (ref 98–110)
Creat: 0.92 mg/dL (ref 0.50–1.03)
Globulin: 3.3 g/dL (ref 1.9–3.7)
Glucose, Bld: 91 mg/dL (ref 65–99)
Potassium: 4.2 mmol/L (ref 3.5–5.3)
Sodium: 140 mmol/L (ref 135–146)
Total Bilirubin: 0.3 mg/dL (ref 0.2–1.2)
Total Protein: 7.3 g/dL (ref 6.1–8.1)
eGFR: 72 mL/min/{1.73_m2} (ref >=60)

## 2024-01-01 LAB — CBC
HCT: 35.8 % (ref 35.0–45.0)
Hemoglobin: 11.4 g/dL — ABNORMAL LOW (ref 11.7–15.5)
MCH: 25.8 pg — ABNORMAL LOW (ref 27.0–33.0)
MCHC: 31.8 g/dL — ABNORMAL LOW (ref 32.0–36.0)
MCV: 81 fL (ref 80.0–100.0)
MPV: 11.5 fL (ref 7.5–12.5)
Platelets: 331 10*3/uL (ref 140–400)
RBC: 4.42 10*6/uL (ref 3.80–5.10)
RDW: 14.2 % (ref 11.0–15.0)
WBC: 8.8 10*3/uL (ref 3.8–10.8)

## 2024-01-01 LAB — LIPID PANEL
Cholesterol: 177 mg/dL (ref <200)
HDL: 62 mg/dL (ref >=50)
LDL Cholesterol (Calc): 101 mg/dL — ABNORMAL HIGH
Non-HDL Cholesterol (Calc): 115 mg/dL (ref <130)
Total CHOL/HDL Ratio: 2.9 (calc) (ref <5.0)
Triglycerides: 53 mg/dL (ref <150)

## 2024-01-01 LAB — HEMOGLOBIN A1C
Hgb A1c MFr Bld: 5.9 %{Hb} — ABNORMAL HIGH (ref <5.7)
Mean Plasma Glucose: 123 mg/dL
eAG (mmol/L): 6.8 mmol/L

## 2024-01-05 ENCOUNTER — Other Ambulatory Visit: Payer: Self-pay | Admitting: Internal Medicine

## 2024-01-05 DIAGNOSIS — E7800 Pure hypercholesterolemia, unspecified: Secondary | ICD-10-CM

## 2024-01-06 NOTE — Telephone Encounter (Signed)
Lab in date  Requested Prescriptions  Pending Prescriptions Disp Refills   atorvastatin (LIPITOR) 10 MG tablet [Pharmacy Med Name: ATORVASTATIN 10 MG TABLET] 90 tablet 1    Sig: TAKE 1 TABLET BY MOUTH EVERY DAY     Cardiovascular:  Antilipid - Statins Failed - 01/06/2024  1:09 PM      Failed - Lipid Panel in normal range within the last 12 months    Cholesterol, Total  Date Value Ref Range Status  03/28/2021 229 (H) 100 - 199 mg/dL Final   Cholesterol  Date Value Ref Range Status  12/31/2023 177 <200 mg/dL Final   LDL Cholesterol (Calc)  Date Value Ref Range Status  12/31/2023 101 (H) mg/dL (calc) Final    Comment:    Reference range: <100 . Desirable range <100 mg/dL for primary prevention;   <70 mg/dL for patients with CHD or diabetic patients  with > or = 2 CHD risk factors. Marland Kitchen LDL-C is now calculated using the Martin-Hopkins  calculation, which is a validated novel method providing  better accuracy than the Friedewald equation in the  estimation of LDL-C.  Horald Pollen et al. Lenox Ahr. 1191;478(29): 2061-2068  (http://education.[facility].com/faq/FAQ164)    HDL  Date Value Ref Range Status  12/31/2023 62 > OR = 50 mg/dL Final  56/21/3086 63 >57 mg/dL Final   Triglycerides  Date Value Ref Range Status  12/31/2023 53 <150 mg/dL Final         Passed - Patient is not pregnant      Passed - Valid encounter within last 12 months    Recent Outpatient Visits           6 days ago Encounter for general adult medical examination with abnormal findings   [facility] [place], [place] W, NP   6 months ago Iron deficiency anemia secondary to inadequate dietary iron intake   [place] [facility] [place], Salvadore Oxford, NP   11 months ago Primary hypertension   [place] [facility] [place], Salvadore Oxford, NP   12 months ago Primary hypertension   [facility] [place], Salvadore Oxford, NP   1 year ago  Encounter for general adult medical examination with abnormal findings   [place] [facility] [place], Salvadore Oxford, NP       Future Appointments             In 2 months Dana Allan, MD [facility], [facility]   In 6 months [place], Salvadore Oxford, NP [facility], [place]

## 2024-03-11 ENCOUNTER — Ambulatory Visit: Payer: Managed Care, Other (non HMO) | Admitting: Family Medicine

## 2024-04-22 ENCOUNTER — Ambulatory Visit

## 2024-05-12 ENCOUNTER — Other Ambulatory Visit: Payer: Self-pay | Admitting: Internal Medicine

## 2024-05-12 DIAGNOSIS — Z1231 Encounter for screening mammogram for malignant neoplasm of breast: Secondary | ICD-10-CM

## 2024-06-03 ENCOUNTER — Ambulatory Visit
Admission: RE | Admit: 2024-06-03 | Discharge: 2024-06-03 | Disposition: A | Discharge status: Home / Self Care | Source: Ambulatory Visit | Attending: Internal Medicine | Admitting: Internal Medicine

## 2024-06-03 DIAGNOSIS — Z1231 Encounter for screening mammogram for malignant neoplasm of breast: Secondary | ICD-10-CM | POA: Diagnosis present

## 2024-06-26 ENCOUNTER — Other Ambulatory Visit: Payer: Self-pay | Admitting: Internal Medicine

## 2024-06-27 NOTE — Telephone Encounter (Signed)
 Requested Prescriptions  Pending Prescriptions Disp Refills   amLODipine -olmesartan  (AZOR ) 10-40 MG tablet [Pharmacy Med Name: AMLODIPINE -OLMESARTAN  10-40 MG] 90 tablet 0    Sig: TAKE 1 TABLET BY MOUTH EVERY DAY     Cardiovascular: CCB + ARB Combos Failed - 06/27/2024  1:19 PM      Failed - Valid encounter within last 6 months    Recent Outpatient Visits   None     Future Appointments             In 1 week Baity, Angeline ORN, NP [facility]            Passed - K in normal range and within 180 days    Potassium  Date Value Ref Range Status  12/31/2023 4.2 3.5 - 5.3 mmol/L Final  07/13/2013 3.5 3.5 - 5.1 mmol/L Final         Passed - Cr in normal range and within 180 days    Creat  Date Value Ref Range Status  12/31/2023 0.92 0.50 - 1.03 mg/dL Final         Passed - Na in normal range and within 180 days    Sodium  Date Value Ref Range Status  12/31/2023 140 135 - 146 mmol/L Final  03/28/2021 139 134 - 144 mmol/L Final  07/13/2013 139 136 - 145 mmol/L Final         Passed - Patient is not pregnant      Passed - Last BP in normal range    BP Readings from Last 1 Encounters:  12/31/23 138/82

## 2024-07-05 ENCOUNTER — Other Ambulatory Visit: Payer: Self-pay | Admitting: Internal Medicine

## 2024-07-05 DIAGNOSIS — E7800 Pure hypercholesterolemia, unspecified: Secondary | ICD-10-CM

## 2024-07-06 ENCOUNTER — Ambulatory Visit: Payer: Self-pay | Admitting: Internal Medicine

## 2024-07-07 NOTE — Telephone Encounter (Signed)
 Requested Prescriptions  Pending Prescriptions Disp Refills   atorvastatin  (LIPITOR) 10 MG tablet [Pharmacy Med Name: ATORVASTATIN  10 MG TABLET] 90 tablet 1    Sig: TAKE 1 TABLET BY MOUTH EVERY DAY     Cardiovascular:  Antilipid - Statins Failed - 07/07/2024 12:46 PM      Failed - Valid encounter within last 12 months    Recent Outpatient Visits   None     Future Appointments             In 3 days Antonette Angeline ORN, NP [facility]            Failed - Lipid Panel in normal range within the last 12 months    Cholesterol, Total  Date Value Ref Range Status  03/28/2021 229 (H) 100 - 199 mg/dL Final   Cholesterol  Date Value Ref Range Status  12/31/2023 177 <200 mg/dL Final   LDL Cholesterol (Calc)  Date Value Ref Range Status  12/31/2023 101 (H) mg/dL (calc) Final    Comment:    Reference range: <100 . Desirable range <100 mg/dL for primary prevention;   <70 mg/dL for patients with CHD or diabetic patients  with > or = 2 CHD risk factors. SABRA LDL-C is now calculated using the Martin-Hopkins  calculation, which is a validated novel method providing  better accuracy than the Friedewald equation in the  estimation of LDL-C.  Gladis APPLETHWAITE et al. SANDREA. 7986;689(80): 2061-2068  (http://education.[facility].com/faq/FAQ164)    HDL  Date Value Ref Range Status  12/31/2023 62 > OR = 50 mg/dL Final  95/87/7977 63 >60 mg/dL Final   Triglycerides  Date Value Ref Range Status  12/31/2023 53 <150 mg/dL Final         Passed - Patient is not pregnant

## 2024-07-10 ENCOUNTER — Encounter: Payer: Self-pay | Admitting: Internal Medicine

## 2024-07-10 ENCOUNTER — Ambulatory Visit: Admitting: Internal Medicine

## 2024-07-10 VITALS — BP 140/80 | HR 80 | Ht 68.0 in | Wt 180.1 lb

## 2024-07-10 DIAGNOSIS — Z6827 Body mass index (BMI) 27.0-27.9, adult: Secondary | ICD-10-CM

## 2024-07-10 DIAGNOSIS — M1990 Unspecified osteoarthritis, unspecified site: Secondary | ICD-10-CM | POA: Insufficient documentation

## 2024-07-10 DIAGNOSIS — E7800 Pure hypercholesterolemia, unspecified: Secondary | ICD-10-CM | POA: Diagnosis not present

## 2024-07-10 DIAGNOSIS — M8589 Other specified disorders of bone density and structure, multiple sites: Secondary | ICD-10-CM

## 2024-07-10 DIAGNOSIS — N951 Menopausal and female climacteric states: Secondary | ICD-10-CM

## 2024-07-10 DIAGNOSIS — R7303 Prediabetes: Secondary | ICD-10-CM | POA: Diagnosis not present

## 2024-07-10 DIAGNOSIS — D508 Other iron deficiency anemias: Secondary | ICD-10-CM

## 2024-07-10 DIAGNOSIS — I10 Essential (primary) hypertension: Secondary | ICD-10-CM

## 2024-07-10 DIAGNOSIS — E663 Overweight: Secondary | ICD-10-CM

## 2024-07-10 DIAGNOSIS — M150 Primary generalized (osteo)arthritis: Secondary | ICD-10-CM

## 2024-07-10 MED ORDER — AMLODIPINE-OLMESARTAN 10-40 MG PO TABS: 1.0000 | ORAL_TABLET | Freq: Every day | ORAL | 0 refills | Status: DC

## 2024-07-10 NOTE — Progress Notes (Signed)
 Subjective:    Patient ID: Jennifer Pace, female    DOB: 1966-05-29, 58 y.o.   MRN: [id number]  HPI  Patient presents the clinic today for 54-month follow-up of chronic conditions.  HTN: Her BP today is 138/84. She is not checking her BP at home. She is taking amlodipine -olmesartan  as prescribed.  ECG from 09/2020 reviewed.  Postmenopausal hot flashes: These are intermittent. She is not currently taking any medications for this.  HLD: Her last LDL was 101, triglycerides 53, 12/2023.  She is taking atorvastatin  as prescribed.  She tries to consume a low-fat diet.  Prediabetes: Her last A1c was 5.9%, 12/2023.  She is not taking any oral diabetic medication at this time.  She does not check her sugars.  Osteopenia: She is not taking any calcium  and vitamin D OTC.  She does get daily weightbearing exercise.  Bone density from 05/2023 reviewed.  Anemia: Her last H/H was 11.4/35.8, 12/2023.  She reports she does not eat a lot of red meat.  She is not taking any oral iron at this time.  She does not follow with hematology.  OA: Mainly in her hands and ankles. She takes ibuprofen as needed with some relief of symptoms.  She does not follow with orthopedics.  Review of Systems  No past medical history on file.  Current Outpatient Medications  Medication Sig Dispense Refill   amLODipine -olmesartan  (AZOR ) 10-40 MG tablet TAKE 1 TABLET BY MOUTH EVERY DAY 90 tablet 0   atorvastatin  (LIPITOR) 10 MG tablet TAKE 1 TABLET BY MOUTH EVERY DAY 90 tablet 1   Calcium  Carb-Cholecalciferol (CALCIUM  + VITAMIN D3 PO) Take by mouth daily at 6 (six) AM.     No current facility-administered medications for this visit.    No Known Allergies  Family History  Problem Relation Age of Onset   Kidney failure Mother    Diabetes Father    Heart Problems Father    Breast cancer Sister 56    Social History   Socioeconomic History   Marital status: Married    Spouse name: Not on file   Number of children:  Not on file   Years of education: Not on file   Highest education level: Not on file  Occupational History   Not on file  Tobacco Use   Smoking status: Never   Smokeless tobacco: Never  Vaping Use   Vaping status: Never Used  Substance and Sexual Activity   Alcohol use: No   Drug use: No   Sexual activity: Yes    Birth control/protection: Surgical  Other Topics Concern   Not on file  Social History Narrative   Not on file   Social Drivers of Health   Financial Resource Strain: Not on file  Food Insecurity: Not on file  Transportation Needs: Not on file  Physical Activity: Not on file  Stress: Not on file  Social Connections: Unknown (04/27/2022)   Received from [facility]   Social Network    Social Network: Not on file  Intimate Partner Violence: Unknown (03/20/2022)   Received from [facility]   HITS    Physically Hurt: Not on file    Insult or Talk Down To: Not on file    Threaten Physical Harm: Not on file    Scream or Curse: Not on file     Constitutional: Denies fever, malaise, fatigue, headache or abrupt weight changes.  HEENT: Denies eye pain, eye redness, ear pain, ringing in the ears, wax buildup, runny  nose, nasal congestion, bloody nose, or sore throat. Respiratory: Denies difficulty breathing, shortness of breath, cough or sputum production.   Cardiovascular: Denies chest pain, chest tightness, palpitations or swelling in the hands or feet.  Gastrointestinal: Denies abdominal pain, bloating, constipation, diarrhea or blood in the stool.  GU:  Denies urgency, frequency, pain with urination, burning sensation, blood in urine, odor or discharge. Musculoskeletal: Patient reports joint pain in hands and ankles.  Denies decrease in range of motion, difficulty with gait, muscle pain or joint swelling.  Skin: Denies redness, rashes, lesions or ulcercations.  Neurological: Patient reports hot flashes.  Denies dizziness, difficulty with memory, difficulty with  speech or problems with balance and coordination.  Psych: Denies anxiety, depression, SI/HI.  No other specific complaints in a complete review of systems (except as listed in HPI above).     Objective:   Physical Exam  BP (!) 140/80   Pulse 80   Ht 5' 8 (1.727 m)   Wt 180 lb 2 oz (81.7 kg)   SpO2 100%   BMI 27.39 kg/m    Wt Readings from Last 3 Encounters:  12/31/23 179 lb 12.8 oz (81.6 kg)  07/09/23 176 lb (79.8 kg)  01/24/23 182 lb (82.6 kg)    General: Appears her stated age, overweight, in NAD. Skin: Warm, dry and intact.  HEENT: Head: normal shape and size; Eyes: sclera white, no icterus, conjunctiva pink, PERRLA and EOMs intact;  Cardiovascular: Normal rate and rhythm. S1,S2 noted.  No murmur, rubs or gallops noted. No JVD or BLE edema. No carotid bruits noted. Pulmonary/Chest: Normal effort and positive vesicular breath sounds. No respiratory distress. No wheezes, rales or ronchi noted.  Musculoskeletal: No joint swelling noted of the hands or ankles. No difficulty with gait.  Neurological: Alert and oriented. Coordination normal.  Psychiatric: Mood and affect normal. Behavior is normal. Judgment and thought content normal.    BMET    Component Value Date/Time   NA 140 12/31/2023 0844   NA 139 03/28/2021 1034   NA 139 07/13/2013 0556   K 4.2 12/31/2023 0844   K 3.5 07/13/2013 0556   CL 103 12/31/2023 0844   CL 107 07/13/2013 0556   CO2 28 12/31/2023 0844   CO2 28 07/13/2013 0556   GLUCOSE 91 12/31/2023 0844   GLUCOSE 113 (H) 07/13/2013 0556   BUN 17 12/31/2023 0844   BUN 13 03/28/2021 1034   BUN 9 07/13/2013 0556   CREATININE 0.92 12/31/2023 0844   CALCIUM  9.6 12/31/2023 0844   CALCIUM  8.6 07/13/2013 0556   GFRNONAA >60 09/30/2020 0333   GFRNONAA >60 07/13/2013 0556   GFRAA >60 07/13/2013 0556    Lipid Panel     Component Value Date/Time   CHOL 177 12/31/2023 0844   CHOL 229 (H) 03/28/2021 1034   TRIG 53 12/31/2023 0844   HDL 62 12/31/2023  0844   HDL 63 03/28/2021 1034   CHOLHDL 2.9 12/31/2023 0844   LDLCALC 101 (H) 12/31/2023 0844    CBC    Component Value Date/Time   WBC 8.8 12/31/2023 0844   RBC 4.42 12/31/2023 0844   HGB 11.4 (L) 12/31/2023 0844   HGB 12.5 07/13/2013 0556   HCT 35.8 12/31/2023 0844   HCT 38.2 07/13/2013 0556   PLT 331 12/31/2023 0844   PLT 282 07/13/2013 0556   MCV 81.0 12/31/2023 0844   MCV 83 07/13/2013 0556   MCH 25.8 (L) 12/31/2023 0844   MCHC 31.8 (L) 12/31/2023 0844   RDW  14.2 12/31/2023 0844   RDW 13.9 07/13/2013 0556   LYMPHSABS 1,842 07/09/2023 0820   EOSABS 131 07/09/2023 0820   BASOSABS 62 07/09/2023 0820    Hgb A1C Lab Results  Component Value Date   HGBA1C 5.9 (H) 12/31/2023           Assessment & Plan:      RTC in 6 months, for your annual exam Angeline Laura, NP

## 2024-07-10 NOTE — Assessment & Plan Note (Signed)
 CBC and iron panel today She has not tolerated iron pills in the past and does not consume red meat

## 2024-07-10 NOTE — Assessment & Plan Note (Signed)
 Okay to continue ibuprofen OTC as needed but avoid overuse as this can lead to hypertension

## 2024-07-10 NOTE — Assessment & Plan Note (Signed)
 C-Met and lipid profile today Encouraged her to consume a low-fat diet Continue atorvastatin  10 mg daily, will adjust if needed based on labs

## 2024-07-10 NOTE — Assessment & Plan Note (Signed)
 She is not interested in medication management at this time We will monitor

## 2024-07-10 NOTE — Assessment & Plan Note (Signed)
 Encourage diet and exercise for weight loss

## 2024-07-10 NOTE — Assessment & Plan Note (Signed)
 A1c today Encourage low-carb diet and exercise for weight loss

## 2024-07-10 NOTE — Patient Instructions (Signed)

## 2024-07-10 NOTE — Assessment & Plan Note (Signed)
 Discussed BP goal of 130/80 Continue amlodipine -olmesartan  10-40 mg daily for now If BP remains consistently elevated, will consider adding additional medication Reinforced DASH diet and exercise for weight loss C-Met today

## 2024-07-10 NOTE — Assessment & Plan Note (Signed)
Continue calcium and vitamin D OTC Encouraged daily weightbearing exercise 

## 2024-07-11 LAB — LIPID PANEL
Cholesterol: 160 mg/dL (ref <200)
HDL: 67 mg/dL (ref >=50)
LDL Cholesterol (Calc): 77 mg/dL
Non-HDL Cholesterol (Calc): 93 mg/dL (ref <130)
Total CHOL/HDL Ratio: 2.4 (calc) (ref <5.0)
Triglycerides: 81 mg/dL (ref <150)

## 2024-07-11 LAB — COMPREHENSIVE METABOLIC PANEL WITH GFR
AG Ratio: 1.4 (calc) (ref 1.0–2.5)
ALT: 9 U/L (ref 6–29)
AST: 15 U/L (ref 10–35)
Albumin: 4 g/dL (ref 3.6–5.1)
Alkaline phosphatase (APISO): 89 U/L (ref 37–153)
BUN: 14 mg/dL (ref 7–25)
CO2: 28 mmol/L (ref 20–32)
Calcium: 9 mg/dL (ref 8.6–10.4)
Chloride: 105 mmol/L (ref 98–110)
Creat: 0.79 mg/dL (ref 0.50–1.05)
Globulin: 2.9 g/dL (ref 1.9–3.7)
Glucose, Bld: 88 mg/dL (ref 65–99)
Potassium: 4.1 mmol/L (ref 3.5–5.3)
Sodium: 141 mmol/L (ref 135–146)
Total Bilirubin: 0.2 mg/dL (ref 0.2–1.2)
Total Protein: 6.9 g/dL (ref 6.1–8.1)
eGFR: 86 mL/min/1.73m2 (ref >=60)

## 2024-07-11 LAB — IRON,TIBC AND FERRITIN PANEL
%SAT: 11 % — ABNORMAL LOW (ref 16–45)
Ferritin: 38 ng/mL (ref 16–232)
Iron: 38 ug/dL — ABNORMAL LOW (ref 45–160)
TIBC: 347 ug/dL (ref 250–450)

## 2024-07-11 LAB — HEMOGLOBIN A1C
Hgb A1c MFr Bld: 5.9 % — ABNORMAL HIGH (ref <5.7)
Mean Plasma Glucose: 123 mg/dL
eAG (mmol/L): 6.8 mmol/L

## 2024-07-11 LAB — CBC
HCT: 34.5 % — ABNORMAL LOW (ref 35.0–45.0)
Hemoglobin: 10.7 g/dL — ABNORMAL LOW (ref 11.7–15.5)
MCH: 25.9 pg — ABNORMAL LOW (ref 27.0–33.0)
MCHC: 31 g/dL — ABNORMAL LOW (ref 32.0–36.0)
MCV: 83.5 fL (ref 80.0–100.0)
MPV: 11.3 fL (ref 7.5–12.5)
Platelets: 324 Thousand/uL (ref 140–400)
RBC: 4.13 Million/uL (ref 3.80–5.10)
RDW: 13.5 % (ref 11.0–15.0)
WBC: 7.8 Thousand/uL (ref 3.8–10.8)

## 2024-07-13 ENCOUNTER — Ambulatory Visit: Payer: Self-pay | Admitting: Internal Medicine

## 2024-07-21 ENCOUNTER — Encounter: Payer: Self-pay | Admitting: Internal Medicine

## 2024-07-21 DIAGNOSIS — Z789 Other specified health status: Secondary | ICD-10-CM

## 2024-07-24 ENCOUNTER — Other Ambulatory Visit

## 2024-07-24 DIAGNOSIS — Z789 Other specified health status: Secondary | ICD-10-CM

## 2024-07-25 LAB — MEASLES/MUMPS/RUBELLA IMMUNITY
Mumps IgG: 199 [AU]/ml
Rubella: 2.68 {index}
Rubeola IgG: >300 [AU]/ml

## 2024-07-25 LAB — HEPATITIS B SURFACE ANTIBODY,QUALITATIVE: Hep B S Ab: NONREACTIVE

## 2024-07-26 ENCOUNTER — Ambulatory Visit: Payer: Self-pay | Admitting: Internal Medicine

## 2024-07-27 ENCOUNTER — Telehealth: Payer: Self-pay

## 2024-07-27 NOTE — Telephone Encounter (Signed)
 Copied from CRM [id number]. Topic: Appointments - Scheduling Inquiry for Clinic >> Jul 27, 2024  9:39 AM Jennifer Pace wrote: Reason for CRM: Patient called to schedule Hep B. Advised we will follow up.

## 2024-07-31 ENCOUNTER — Ambulatory Visit (INDEPENDENT_AMBULATORY_CARE_PROVIDER_SITE_OTHER)

## 2024-07-31 DIAGNOSIS — Z23 Encounter for immunization: Secondary | ICD-10-CM

## 2024-09-25 ENCOUNTER — Encounter: Payer: Self-pay | Admitting: Nurse Practitioner

## 2024-09-25 ENCOUNTER — Telehealth: Payer: Self-pay

## 2024-09-25 ENCOUNTER — Ambulatory Visit: Admitting: Nurse Practitioner

## 2024-09-25 VITALS — BP 122/70 | HR 80 | Temp 98.1°F | Ht 68.0 in | Wt 178.8 lb

## 2024-09-25 DIAGNOSIS — R6882 Decreased libido: Secondary | ICD-10-CM

## 2024-09-25 DIAGNOSIS — Z23 Encounter for immunization: Secondary | ICD-10-CM

## 2024-09-25 NOTE — Progress Notes (Signed)
 Established Patient Office Visit  Subjective:  Patient ID: Jennifer Pace, female    DOB: 1966/11/19  Age: 58 y.o. MRN: [id number]  CC:  Chief Complaint  Patient presents with   Establish Care    Transfer of Care   Discussed the use of a AI scribe software for clinical note transcription with the patient, who gave verbal consent to proceed.  HPI  Jennifer Pace presents for initially for TOC but decided not to do TOC.  She experiences decreased sexual drive and vaginal dryness, which she attributes to age and has not yet sought treatment. Patient reported that she made this appointment thinking the she is seeing a Gyn.    Flu vaccine provided during the visit.   HPI   History reviewed. No pertinent past medical history.  Past Surgical History:  Procedure Laterality Date   ABDOMINAL HYSTERECTOMY     COLONOSCOPY N/A 05/23/2015   Procedure: COLONOSCOPY;  Surgeon: Lamar ONEIDA Holmes, MD;  Location: [facility] ENDOSCOPY;  Service: Endoscopy;  Laterality: N/A;   REDUCTION MAMMAPLASTY      Family History  Problem Relation Age of Onset   Kidney failure Mother    Diabetes Father    Heart Problems Father    Breast cancer Sister 33    Social History   Socioeconomic History   Marital status: Married    Spouse name: Not on file   Number of children: Not on file   Years of education: Not on file   Highest education level: Bachelor's degree (e.g., BA, AB, BS)  Occupational History   Not on file  Tobacco Use   Smoking status: Never   Smokeless tobacco: Never  Vaping Use   Vaping status: Never Used  Substance and Sexual Activity   Alcohol use: No   Drug use: No   Sexual activity: Yes    Birth control/protection: Surgical  Other Topics Concern   Not on file  Social History Narrative   Lives with husband and daughter.   Social Drivers of Corporate Investment Banker Strain: Low Risk  (09/21/2024)   Overall Financial Resource Strain (CARDIA)    Difficulty of Paying  Living Expenses: Not hard at all  Food Insecurity: No Food Insecurity (09/21/2024)   Hunger Vital Sign    Worried About Running Out of Food in the Last Year: Never true    Ran Out of Food in the Last Year: Never true  Transportation Needs: No Transportation Needs (09/21/2024)   PRAPARE - Administrator, Civil Service (Medical): No    Lack of Transportation (Non-Medical): No  Physical Activity: Sufficiently Active (09/21/2024)   Exercise Vital Sign    Days of Exercise per Week: 4 days    Minutes of Exercise per Session: 60 min  Stress: No Stress Concern Present (09/21/2024)   [employer] of Occupational Health - Occupational Stress Questionnaire    Feeling of Stress: Not at all  Social Connections: Socially Integrated (09/21/2024)   Social Connection and Isolation Panel    Frequency of Communication with Friends and Family: More than three times a week    Frequency of Social Gatherings with Friends and Family: Twice a week    Attends Religious Services: More than 4 times per year    Active Member of [employer] or Organizations: Yes    Attends Banker Meetings: More than 4 times per year    Marital Status: Married  Intimate Partner Violence: Unknown (03/20/2022)   Received from [facility]  HITS    Physically Hurt: Not on file    Insult or Talk Down To: Not on file    Threaten Physical Harm: Not on file    Scream or Curse: Not on file     Outpatient Medications Prior to Visit  Medication Sig Dispense Refill   amLODipine -olmesartan  (AZOR ) 10-40 MG tablet Take 1 tablet by mouth daily. 90 tablet 0   atorvastatin  (LIPITOR) 10 MG tablet TAKE 1 TABLET BY MOUTH EVERY DAY 90 tablet 1   Calcium  Carb-Cholecalciferol (CALCIUM  + VITAMIN D3 PO) Take by mouth daily at 6 (six) AM.     No facility-administered medications prior to visit.    No Known Allergies  ROS Review of Systems Negative unless indicated in HPI.    Objective:    Physical Exam  BP 122/70    Pulse 80   Temp 98.1 F (36.7 C)   Ht 5' 8 (1.727 m)   Wt 178 lb 12.8 oz (81.1 kg)   SpO2 99%   BMI 27.19 kg/m  Wt Readings from Last 3 Encounters:  09/25/24 178 lb 12.8 oz (81.1 kg)  07/10/24 180 lb 2 oz (81.7 kg)  12/31/23 179 lb 12.8 oz (81.6 kg)     Health Maintenance  Topic Date Due   Zoster Vaccines- Shingrix (1 of 2) Never done   Pneumococcal Vaccine: 50+ Years (1 of 1 - PCV) Never done   COVID-19 Vaccine (9 - 2025-26 season) 08/17/2024   Colonoscopy  05/22/2025   Cervical Cancer Screening (HPV/Pap Cotest)  03/15/2026   Mammogram  06/03/2026   DTaP/Tdap/Td (4 - Td or Tdap) 12/24/2032   Influenza Vaccine  Completed   Hepatitis B Vaccines 19-59 Average Risk  Completed   Hepatitis C Screening  Completed   HIV Screening  Completed   HPV VACCINES  Aged Out   Meningococcal B Vaccine  Aged Out    There are no preventive care reminders to display for this patient.   Lab Results  Component Value Date   TSH 2.030 03/28/2021   Lab Results  Component Value Date   WBC 7.8 07/10/2024   HGB 10.7 (L) 07/10/2024   HCT 34.5 (L) 07/10/2024   MCV 83.5 07/10/2024   PLT 324 07/10/2024   Lab Results  Component Value Date   NA 141 07/10/2024   K 4.1 07/10/2024   CO2 28 07/10/2024   GLUCOSE 88 07/10/2024   BUN 14 07/10/2024   CREATININE 0.79 07/10/2024   BILITOT 0.2 07/10/2024   ALKPHOS 87 03/28/2021   AST 15 07/10/2024   ALT 9 07/10/2024   PROT 6.9 07/10/2024   ALBUMIN 3.9 03/28/2021   CALCIUM  9.0 07/10/2024   ANIONGAP 7 09/30/2020   EGFR 86 07/10/2024   Lab Results  Component Value Date   CHOL 160 07/10/2024   Lab Results  Component Value Date   HDL 67 07/10/2024   Lab Results  Component Value Date   LDLCALC 77 07/10/2024   Lab Results  Component Value Date   TRIG 81 07/10/2024   Lab Results  Component Value Date   CHOLHDL 2.4 07/10/2024   Lab Results  Component Value Date   HGBA1C 5.9 (H) 07/10/2024      Assessment & Plan:  Need for  immunization against influenza -     Flu vaccine trivalent PF, 6mos and older(Flulaval,Afluria,Fluarix,Fluzone)    Follow-up: No follow-ups on file.   Cristen Murcia, NP

## 2024-09-25 NOTE — Telephone Encounter (Signed)
 Patient was asked by myself and Chelsea Aurora if she was here for a TOC and who her last PCP was. Patient went through the whole visit then states that she thought this was OB/GYN.

## 2024-09-25 NOTE — Telephone Encounter (Signed)
 At check-out today, patient states she is not transferring her care to Chelsea Aurora, NP. Patient states she thought this was an OV/GYN visit.

## 2024-09-30 ENCOUNTER — Ambulatory Visit (INDEPENDENT_AMBULATORY_CARE_PROVIDER_SITE_OTHER)

## 2024-09-30 DIAGNOSIS — Z23 Encounter for immunization: Secondary | ICD-10-CM | POA: Diagnosis not present

## 2024-10-12 NOTE — Assessment & Plan Note (Signed)
 Decreased libido. Discussed with patient that I can refer her to GYN for her concern. - Referral sent to GYN

## 2024-12-23 ENCOUNTER — Other Ambulatory Visit: Payer: Self-pay | Admitting: Internal Medicine

## 2024-12-24 NOTE — Telephone Encounter (Signed)
 Requested Prescriptions  Pending Prescriptions Disp Refills   amLODipine -olmesartan  (AZOR ) 10-40 MG tablet [Pharmacy Med Name: AMLODIPINE -OLMESARTAN  10-40 MG] 90 tablet 0    Sig: TAKE 1 TABLET BY MOUTH EVERY DAY     Cardiovascular: CCB + ARB Combos Passed - 12/24/2024 11:52 AM      Passed - K in normal range and within 180 days    Potassium  Date Value Ref Range Status  07/10/2024 4.1 3.5 - 5.3 mmol/L Final  07/13/2013 3.5 3.5 - 5.1 mmol/L Final         Passed - Cr in normal range and within 180 days    Creat  Date Value Ref Range Status  07/10/2024 0.79 0.50 - 1.05 mg/dL Final         Passed - Na in normal range and within 180 days    Sodium  Date Value Ref Range Status  07/10/2024 141 135 - 146 mmol/L Final  03/28/2021 139 134 - 144 mmol/L Final  07/13/2013 139 136 - 145 mmol/L Final         Passed - Patient is not pregnant      Passed - Last BP in normal range    BP Readings from Last 1 Encounters:  09/25/24 122/70         Passed - Valid encounter within last 6 months    Recent Outpatient Visits           5 months ago Pure hypercholesterolemia   [place] [facility] [place], Angeline ORN, NP

## 2024-12-31 NOTE — Progress Notes (Signed)
 Foot and Ankle Surgery Patient Visit  Chief Complaint:   Chief Complaint  Patient presents with   New Patient   Foot Pain    Right top of ft pain x 2 months    Ankle Problem    C/o ankles turning inwards when weight-bearing.     Subjective  HPI  Jennifer Pace is a 59 y.o. female who presents for New Patient, Foot Pain (Right top of ft pain x 2 months ), and Ankle Problem (C/o ankles turning inwards when weight-bearing. ) History of Present Illness Jennifer Pace is a 59 year old female with right flatfoot and right ankle instability who presents for evaluation of worsening right dorsal foot pain and instability.  Over the past two months, she has experienced progressive aching pain localized to the dorsal aspect of the right foot, rated as 6 out of 10, which is exacerbated by weight bearing. She reports both ankles turning inward, with greater involvement on the left, and expresses concern regarding right foot inversion and instability, particularly during ambulation.  She maintains an active lifestyle with regular walking and exercise, denies a specific inciting event, but suspects possible overuse. She does not use orthotics or shoe inserts. Aleve provides symptomatic relief. She typically wears Brooks shoes for workouts and wore a different pair of shoes to today's visit.  Imaging reveals a flat foot structure with decreased longitudinal arch and a small plantar calcaneal heel spur.  There is no problem list on file for this patient.   No Known Allergies  Social Drivers of Health   Tobacco Use: Low Risk (01/04/2025)   Received from [facility]   Patient History    Smoking Tobacco Use: Never    Smokeless Tobacco Use: Never    Passive Exposure: Not on file  Alcohol Use: Not on file  Financial Resource Strain: Low Risk  (01/04/2025)   Overall Financial Resource Strain (CARDIA)    Difficulty of Paying Living Expenses: Not hard at all  Food Insecurity: No Food Insecurity  (01/04/2025)   Hunger Vital Sign    Worried About Running Out of Food in the Last Year: Never true    Ran Out of Food in the Last Year: Never true  Transportation Needs: No Transportation Needs (01/04/2025)   PRAPARE - Administrator, Civil Service (Medical): No    Lack of Transportation (Non-Medical): No  Physical Activity: Sufficiently Active (09/21/2024)   Received from [facility]   Exercise Vital Sign    On average, how many days per week do you engage in moderate to strenuous exercise (like a brisk walk)?: 4 days    On average, how many minutes do you engage in exercise at this level?: 60 min  Stress: No Stress Concern Present (09/21/2024)   Received from [facility] of Occupational Health - Occupational Stress Questionnaire    Do you feel stress - tense, restless, nervous, or anxious, or unable to sleep at night because your mind is troubled all the time - these days?: Not at all  Social Connections: Socially Integrated (09/21/2024)   Received from [facility]   Social Connection and Isolation Panel    In a typical week, how many times do you talk on the phone with family, friends, or neighbors?: More than three times a week    How often do you get together with friends or relatives?: Twice a week    How often do you attend church or religious services?: More than 4  times per year    Do you belong to any clubs or organizations such as church groups, unions, fraternal or athletic groups, or school groups?: Yes    How often do you attend meetings of the clubs or organizations you belong to?: More than 4 times per year    Are you married, widowed, divorced, separated, never married, or living with a partner?: Married  Depression: Not on file  Housing Stability: Low Risk  (01/04/2025)   Housing Stability Vital Sign    Unable to Pay for Housing in the Last Year: No    Number of Times Moved in the Last Year: 0    Homeless in the Last Year: No   Utilities: Not At Risk (01/04/2025)   [facility] Utilities    Threatened with loss of utilities: No  Health Literacy: Not on file    Outpatient Medications Prior to Visit  Medication Sig Dispense Refill   amLODIPine -olmesartan  (AZOR ) 10-40 mg tablet Take 1 tablet by mouth once daily     atorvastatin  (LIPITOR) 10 MG tablet Take 10 mg by mouth once daily     polyethylene glycol (MIRALAX) powder Take as directed for colonoscopy prep. 255 g 0   No facility-administered medications prior to visit.      Objective  Vitals:   01/04/25 1500  Weight: 81.2 kg (179 lb)  Height: 170.2 cm (5' 7)  PainSc:   6  PainLoc: Foot   Body mass index is 28.04 kg/m.  Home Vitals:     Physical Exam Physical Exam   General/Constitutional: No apparent distress: well-nourished and well developed.   Psych: Normal mood and affect, oriented to person, place and time (AOx3)   Vascular: DP/PT pulses palpable bilateral, capillary fill time intact to digits bilaterally, hair growth present to digits of bilateral lower extremities.   Neuro: Light touch sensation intact bilateral lower extremities.   Derm: No open lesions or ulcerations present to bilateral lower extremities.   MSK: 5/5 muscle strength bilateral lower extremity muscle groups.  Pes planus foot structure bilaterally.  Pain on palpation of the right sinus tarsi.  No pain on palpation of the posterior tibial tendon course bilaterally at the navicular tuberosity as well as medial ankle bilateral.  Appears to have some degree of limited ankle joint dorsiflexion with the knee extended which mildly improved knee flexion bilaterally.  Results Radiology Right foot x-ray, three views (AP, lateral, oblique) (2025-01-04): No acute fracture or dislocation. Minimal to no degenerative changes throughout foot and ankle. Pes planus with decreased longitudinal arch, talar head coverage approximately 40%, increased forefoot abduction. Plantar calcaneal heel  spur. Mild contracture of digits two through four. No abnormality of tarsal bones. Talus rotated medially. 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 No acute fracture or significant arthritis.  Possibly some mild arthritis around the subtalar joint near the middle facet.  Symptoms exacerbated by overuse and insufficient arch support. Subacute duration. Increased future arthritis risk if uncorrected. Conservative management recommended. - Recommended PowerStep or similar over-the-counter orthotic inserts for arch support and pronation reduction. - Advised orthotic purchase from local retailer and provided product selection guidance. - Recommended supportive, stiffer footwear (e.g., Brooks) to improve stability and minimize overpronation. - Provided right ankle brace for 2-3 weeks to stabilize ankle and reduce instability.  Dispensed today for usage. - Discussed corticosteroid injection  for persistent pain; deferred per her preference.  Could consider in the future if worsening or remains unimproved. - Advised continued use of NSAIDs (e.g., Aleve) or acetaminophen for pain management as needed. - Provided printed educational materials regarding flatfoot and management options.  Diagnoses and all orders for this visit:  Sinus tarsitis, right  Acquired pes planus of both feet  Hypermobile joints  Right foot pain -     X-ray foot right 3 plus views; Future    Return if symptoms worsen or fail to improve.  An after visit summary was provided for the patient either in written format (printed) or through MyChart if needed/necessary .  This note has been created using automated tools and reviewed for accuracy by ANDREW MICHAEL BAKER.  Translation done by AI may create words close to  the sound of words used at visit, but errors could be present.  Note was reviewed.

## 2025-01-01 NOTE — Progress Notes (Unsigned)
 "   Antonette Angeline ORN, NP   No chief complaint on file.   HPI:      Jennifer Pace is a 59 y.o. G1P1001 whose LMP was No LMP recorded. Patient has had a hysterectomy., presents today for ***     Patient Active Problem List   Diagnosis Date Noted   OA (osteoarthritis) 07/10/2024   Osteopenia 07/09/2023   Decreased libido 07/09/2023   Iron deficiency anemia 01/07/2023   HTN (hypertension) 12/24/2022   Pure hypercholesterolemia 10/13/2021   Prediabetes 10/13/2021   Overweight with body mass index (BMI) of 27 to 27.9 in adult 10/13/2021   Hot flash, menopausal 03/21/2018    Past Surgical History:  Procedure Laterality Date   ABDOMINAL HYSTERECTOMY     COLONOSCOPY N/A 05/23/2015   Procedure: COLONOSCOPY;  Surgeon: Lamar ONEIDA Holmes, MD;  Location: [facility] ENDOSCOPY;  Service: Endoscopy;  Laterality: N/A;   REDUCTION MAMMAPLASTY      Family History  Problem Relation Age of Onset   Kidney failure Mother    Diabetes Father    Heart Problems Father    Breast cancer Sister 68    Social History   Socioeconomic History   Marital status: Married    Spouse name: Not on file   Number of children: Not on file   Years of education: Not on file   Highest education level: Bachelor's degree (e.g., BA, AB, BS)  Occupational History   Not on file  Tobacco Use   Smoking status: Never   Smokeless tobacco: Never  Vaping Use   Vaping status: Never Used  Substance and Sexual Activity   Alcohol use: No   Drug use: No   Sexual activity: Yes    Birth control/protection: Surgical  Other Topics Concern   Not on file  Social History Narrative   Lives with husband and daughter.   Social Drivers of Health   Tobacco Use: Low Risk (09/25/2024)   Patient History    Smoking Tobacco Use: Never    Smokeless Tobacco Use: Never    Passive Exposure: Not on file  Financial Resource Strain: Low Risk (09/21/2024)   Overall Financial Resource Strain (CARDIA)    Difficulty of Paying  Living Expenses: Not hard at all  Food Insecurity: No Food Insecurity (09/21/2024)   Epic    Worried About Programme Researcher, Broadcasting/film/video in the Last Year: Never true    Ran Out of Food in the Last Year: Never true  Transportation Needs: No Transportation Needs (09/21/2024)   Epic    Lack of Transportation (Medical): No    Lack of Transportation (Non-Medical): No  Physical Activity: Sufficiently Active (09/21/2024)   Exercise Vital Sign    Days of Exercise per Week: 4 days    Minutes of Exercise per Session: 60 min  Stress: No Stress Concern Present (09/21/2024)   [employer] of Occupational Health - Occupational Stress Questionnaire    Feeling of Stress: Not at all  Social Connections: Socially Integrated (09/21/2024)   Social Connection and Isolation Panel    Frequency of Communication with Friends and Family: More than three times a week    Frequency of Social Gatherings with Friends and Family: Twice a week    Attends Religious Services: More than 4 times per year    Active Member of [employer] or Organizations: Yes    Attends Banker Meetings: More than 4 times per year    Marital Status: Married  Intimate Partner Violence: Unknown (03/20/2022)   Received  from [facility]   HITS    Physically Hurt: Not on file    Insult or Talk Down To: Not on file    Threaten Physical Harm: Not on file    Scream or Curse: Not on file  Depression (PHQ2-9): Low Risk (09/25/2024)   Depression (PHQ2-9)    PHQ-2 Score: 0  Alcohol Screen: Not on file  Housing: Unknown (09/21/2024)   Epic    Unable to Pay for Housing in the Last Year: No    Number of Times Moved in the Last Year: Not on file    Homeless in the Last Year: No  Utilities: Not on file  Health Literacy: Not on file    Outpatient Medications Prior to Visit  Medication Sig Dispense Refill   amLODipine -olmesartan  (AZOR ) 10-40 MG tablet TAKE 1 TABLET BY MOUTH EVERY DAY 90 tablet 0   atorvastatin  (LIPITOR) 10 MG tablet TAKE 1 TABLET  BY MOUTH EVERY DAY 90 tablet 1   Calcium  Carb-Cholecalciferol (CALCIUM  + VITAMIN D3 PO) Take by mouth daily at 6 (six) AM.     No facility-administered medications prior to visit.      ROS:  Review of Systems   OBJECTIVE:   Vitals:  There were no vitals taken for this visit.  Physical Exam  Results: No results found for this or any previous visit (from the past 24 hours).   Assessment/Plan: 1. Decreased libido (Primary)     No orders of the defined types were placed in this encounter.    Rollo JINNY Maxin, CMA 01/01/2025 1:26 PM       "

## 2025-01-02 ENCOUNTER — Other Ambulatory Visit: Payer: Self-pay | Admitting: Internal Medicine

## 2025-01-02 DIAGNOSIS — E7800 Pure hypercholesterolemia, unspecified: Secondary | ICD-10-CM

## 2025-01-04 ENCOUNTER — Ambulatory Visit: Admitting: Internal Medicine

## 2025-01-04 ENCOUNTER — Encounter: Payer: Self-pay | Admitting: Internal Medicine

## 2025-01-04 VITALS — BP 118/80 | Ht 68.0 in | Wt 181.2 lb

## 2025-01-04 DIAGNOSIS — Z1231 Encounter for screening mammogram for malignant neoplasm of breast: Secondary | ICD-10-CM | POA: Diagnosis not present

## 2025-01-04 DIAGNOSIS — R7303 Prediabetes: Secondary | ICD-10-CM

## 2025-01-04 DIAGNOSIS — E7800 Pure hypercholesterolemia, unspecified: Secondary | ICD-10-CM

## 2025-01-04 DIAGNOSIS — Z1211 Encounter for screening for malignant neoplasm of colon: Secondary | ICD-10-CM | POA: Diagnosis not present

## 2025-01-04 DIAGNOSIS — E663 Overweight: Secondary | ICD-10-CM | POA: Diagnosis not present

## 2025-01-04 DIAGNOSIS — Z0001 Encounter for general adult medical examination with abnormal findings: Secondary | ICD-10-CM | POA: Diagnosis not present

## 2025-01-04 DIAGNOSIS — Z6827 Body mass index (BMI) 27.0-27.9, adult: Secondary | ICD-10-CM | POA: Diagnosis not present

## 2025-01-04 NOTE — Telephone Encounter (Signed)
 Requested Prescriptions  Pending Prescriptions Disp Refills   atorvastatin  (LIPITOR) 10 MG tablet [Pharmacy Med Name: ATORVASTATIN  10 MG TABLET] 90 tablet 1    Sig: TAKE 1 TABLET BY MOUTH EVERY DAY     Cardiovascular:  Antilipid - Statins Failed - 01/04/2025 12:24 PM      Failed - Lipid Panel in normal range within the last 12 months    Cholesterol, Total  Date Value Ref Range Status  03/28/2021 229 (H) 100 - 199 mg/dL Final   Cholesterol  Date Value Ref Range Status  07/10/2024 160 <200 mg/dL Final   LDL Cholesterol (Calc)  Date Value Ref Range Status  07/10/2024 77 mg/dL (calc) Final    Comment:    Reference range: <100 . Desirable range <100 mg/dL for primary prevention;   <70 mg/dL for patients with CHD or diabetic patients  with > or = 2 CHD risk factors. SABRA LDL-C is now calculated using the Martin-Hopkins  calculation, which is a validated novel method providing  better accuracy than the Friedewald equation in the  estimation of LDL-C.  Gladis APPLETHWAITE et al. SANDREA. 7986;689(80): 2061-2068  (http://education.[facility].com/faq/FAQ164)    HDL  Date Value Ref Range Status  07/10/2024 67 > OR = 50 mg/dL Final  95/87/7977 63 >60 mg/dL Final   Triglycerides  Date Value Ref Range Status  07/10/2024 81 <150 mg/dL Final         Passed - Patient is not pregnant      Passed - Valid encounter within last 12 months    Recent Outpatient Visits           Today Encounter for general adult medical examination with abnormal findings   [facility] [place], Angeline ORN, NP   5 months ago Pure hypercholesterolemia   [facility] [place], Angeline ORN, [place]

## 2025-01-04 NOTE — Progress Notes (Signed)
 "  Subjective:    Patient ID: Jennifer Pace, female    DOB: 11-28-1966, 59 y.o.   MRN: [id number]  HPI  Patient presents to clinic today for her annual exam.   Flu: 09/2024 Tetanus: 12/2022 COVID: x 7 Shingrix: Never Pap smear: Hysterectomy Mammogram: 05/2024 Bone density: 05/2023 Colon screening: 05/2015 Vision screening: annually Dentist: dentures  Diet: She does not eat a lot of meat. She consumes fruits and veggies. She tries to avoid fried foods. She drinks mostly water. Exercise: body weight exericse, spin and walking  Review of Systems  No past medical history on file.  Current Outpatient Medications  Medication Sig Dispense Refill   amLODipine -olmesartan  (AZOR ) 10-40 MG tablet TAKE 1 TABLET BY MOUTH EVERY DAY 90 tablet 0   atorvastatin  (LIPITOR) 10 MG tablet TAKE 1 TABLET BY MOUTH EVERY DAY 90 tablet 1   Calcium  Carb-Cholecalciferol (CALCIUM  + VITAMIN D3 PO) Take by mouth daily at 6 (six) AM.     No current facility-administered medications for this visit.    No Known Allergies  Family History  Problem Relation Age of Onset   Kidney failure Mother    Diabetes Father    Heart Problems Father    Breast cancer Sister 32    Social History   Socioeconomic History   Marital status: Married    Spouse name: Not on file   Number of children: Not on file   Years of education: Not on file   Highest education level: Bachelor's degree (e.g., BA, AB, BS)  Occupational History   Not on file  Tobacco Use   Smoking status: Never   Smokeless tobacco: Never  Vaping Use   Vaping status: Never Used  Substance and Sexual Activity   Alcohol use: No   Drug use: No   Sexual activity: Yes    Birth control/protection: Surgical  Other Topics Concern   Not on file  Social History Narrative   Lives with husband and daughter.   Social Drivers of Health   Tobacco Use: Low Risk (09/25/2024)   Patient History    Smoking Tobacco Use: Never    Smokeless Tobacco  Use: Never    Passive Exposure: Not on file  Financial Resource Strain: Low Risk (09/21/2024)   Overall Financial Resource Strain (CARDIA)    Difficulty of Paying Living Expenses: Not hard at all  Food Insecurity: No Food Insecurity (09/21/2024)   Epic    Worried About Programme Researcher, Broadcasting/film/video in the Last Year: Never true    Ran Out of Food in the Last Year: Never true  Transportation Needs: No Transportation Needs (09/21/2024)   Epic    Lack of Transportation (Medical): No    Lack of Transportation (Non-Medical): No  Physical Activity: Sufficiently Active (09/21/2024)   Exercise Vital Sign    Days of Exercise per Week: 4 days    Minutes of Exercise per Session: 60 min  Stress: No Stress Concern Present (09/21/2024)   [employer] of Occupational Health - Occupational Stress Questionnaire    Feeling of Stress: Not at all  Social Connections: Socially Integrated (09/21/2024)   Social Connection and Isolation Panel    Frequency of Communication with Friends and Family: More than three times a week    Frequency of Social Gatherings with Friends and Family: Twice a week    Attends Religious Services: More than 4 times per year    Active Member of [employer] or Organizations: Yes    Attends Banker Meetings: More  than 4 times per year    Marital Status: Married  Intimate Partner Violence: Unknown (03/20/2022)   Received from [facility]   HITS    Physically Hurt: Not on file    Insult or Talk Down To: Not on file    Threaten Physical Harm: Not on file    Scream or Curse: Not on file  Depression (PHQ2-9): Low Risk (09/25/2024)   Depression (PHQ2-9)    PHQ-2 Score: 0  Alcohol Screen: Not on file  Housing: Unknown (09/21/2024)   Epic    Unable to Pay for Housing in the Last Year: No    Number of Times Moved in the Last Year: Not on file    Homeless in the Last Year: No  Utilities: Not on file  Health Literacy: Not on file     Constitutional: Denies fever, malaise, fatigue,  headache or abrupt weight changes.  HEENT: Denies eye pain, ear pain, ringing in the ears, wax buildup, runny nose, nasal congestion, bloody nose, or sore throat. Respiratory: Denies difficulty breathing, shortness of breath, cough or sputum production.   Cardiovascular: Denies chest pain, chest tightness, palpitations or swelling in the hands or feet.  Gastrointestinal: Pt reports epigastric pain. Denies bloating, constipation, diarrhea or blood in the stool.  GU: Denies urgency, frequency, pain with urination, burning sensation, blood in urine, odor or discharge. Musculoskeletal: Patient reports intermittent joint pain.  Denies decrease in range of motion, difficulty with gait, muscle pain or joint swelling.  Skin: Denies redness, rashes, lesions or ulcercations.  Neurological: Pt reports hot flashes. Denies dizziness, difficulty with memory, difficulty with speech or problems with balance and coordination.  Psych: Denies anxiety, depression, SI/HI.  No other specific complaints in a complete review of systems (except as listed in HPI above).     Objective:   Physical Exam BP 118/80 (BP Location: Left Arm, Patient Position: Sitting, Cuff Size: Large)   Ht 5' 8 (1.727 m)   Wt 181 lb 3.2 oz (82.2 kg)   BMI 27.55 kg/m     Wt Readings from Last 3 Encounters:  09/25/24 178 lb 12.8 oz (81.1 kg)  07/10/24 180 lb 2 oz (81.7 kg)  12/31/23 179 lb 12.8 oz (81.6 kg)    General: Appears her stated age, overweight, in NAD. Skin: Warm, dry and intact.  HEENT: Head: normal shape and size; Eyes: sclera white, no icterus, conjunctiva pink, PERRLA and EOMs intact;  Neck:  Neck supple, trachea midline. No masses, lumps or thyromegaly present.  Cardiovascular: Normal rate and rhythm. S1,S2 noted.  No murmur, rubs or gallops noted. No JVD or BLE edema. No carotid bruits noted. Pulmonary/Chest: Normal effort and positive vesicular breath sounds. No respiratory distress. No wheezes, rales or ronchi  noted.  Abdomen: Normal bowel sounds.  Musculoskeletal: Strength 5/5 BUE/BLE.  No difficulty with gait.  Neurological: Alert and oriented. Cranial nerves II-XII grossly intact. Coordination normal.  Psychiatric: Mood and affect normal. Behavior is normal. Judgment and thought content normal.     BMET    Component Value Date/Time   NA 141 07/10/2024 1337   NA 139 03/28/2021 1034   NA 139 07/13/2013 0556   K 4.1 07/10/2024 1337   K 3.5 07/13/2013 0556   CL 105 07/10/2024 1337   CL 107 07/13/2013 0556   CO2 28 07/10/2024 1337   CO2 28 07/13/2013 0556   GLUCOSE 88 07/10/2024 1337   GLUCOSE 113 (H) 07/13/2013 0556   BUN 14 07/10/2024 1337   BUN 13  03/28/2021 1034   BUN 9 07/13/2013 0556   CREATININE 0.79 07/10/2024 1337   CALCIUM  9.0 07/10/2024 1337   CALCIUM  8.6 07/13/2013 0556   GFRNONAA >60 09/30/2020 0333   GFRNONAA >60 07/13/2013 0556   GFRAA >60 07/13/2013 0556    Lipid Panel     Component Value Date/Time   CHOL 160 07/10/2024 1337   CHOL 229 (H) 03/28/2021 1034   TRIG 81 07/10/2024 1337   HDL 67 07/10/2024 1337   HDL 63 03/28/2021 1034   CHOLHDL 2.4 07/10/2024 1337   LDLCALC 77 07/10/2024 1337    CBC    Component Value Date/Time   WBC 7.8 07/10/2024 1337   RBC 4.13 07/10/2024 1337   HGB 10.7 (L) 07/10/2024 1337   HGB 12.5 07/13/2013 0556   HCT 34.5 (L) 07/10/2024 1337   HCT 38.2 07/13/2013 0556   PLT 324 07/10/2024 1337   PLT 282 07/13/2013 0556   MCV 83.5 07/10/2024 1337   MCV 83 07/13/2013 0556   MCH 25.9 (L) 07/10/2024 1337   MCHC 31.0 (L) 07/10/2024 1337   RDW 13.5 07/10/2024 1337   RDW 13.9 07/13/2013 0556   LYMPHSABS 1,842 07/09/2023 0820   EOSABS 131 07/09/2023 0820   BASOSABS 62 07/09/2023 0820    Hgb A1C Lab Results  Component Value Date   HGBA1C 5.9 (H) 07/10/2024           Assessment & Plan:   Preventative Health Maintenance:  Flu shot UTD Tdap UTD Encouraged her to get her COVID booster Discussed Shingrix vaccine, she  will check coverage with her insurance and schedule a nurse visit if she would like to have this done She no longer needs Pap smears Mammogram ordered-she will call to schedule Bone density UTD Referral to GI for screening colonoscopy Encouraged her to consume a balanced diet and exercise regimen Advised her to see an eye doctor and dentist annually We will check CBC, c-Met, lipid, A1c today  RTC in 6 months, follow-up chronic conditions Angeline Laura, NP  "

## 2025-01-04 NOTE — Assessment & Plan Note (Signed)
 Encourage diet and exercise for weight loss

## 2025-01-04 NOTE — Patient Instructions (Signed)
 Health Maintenance for Postmenopausal Women Menopause is a normal process in which your ability to get pregnant comes to an end. This process happens slowly over many months or years, usually between the ages of 76 and 38. Menopause is complete when you have missed your menstrual period for 12 months. It is important to talk with your health care provider about some of the most common conditions that affect women after menopause (postmenopausal women). These include heart disease, cancer, and bone loss (osteoporosis). Adopting a healthy lifestyle and getting preventive care can help to promote your health and wellness. The actions you take can also lower your chances of developing some of these common conditions. What are the signs and symptoms of menopause? During menopause, you may have the following symptoms: Hot flashes. These can be moderate or severe. Night sweats. Decrease in sex drive. Mood swings. Headaches. Tiredness (fatigue). Irritability. Memory problems. Problems falling asleep or staying asleep. Talk with your health care provider about treatment options for your symptoms. Do I need hormone replacement therapy? Hormone replacement therapy is effective in treating symptoms that are caused by menopause, such as hot flashes and night sweats. Hormone replacement carries certain risks, especially as you become older. If you are thinking about using estrogen or estrogen with progestin, discuss the benefits and risks with your health care provider. How can I reduce my risk for heart disease and stroke? The risk of heart disease, heart attack, and stroke increases as you age. One of the causes may be a change in the body's hormones during menopause. This can affect how your body uses dietary fats, triglycerides, and cholesterol. Heart attack and stroke are medical emergencies. There are many things that you can do to help prevent heart disease and stroke. Watch your blood pressure High  blood pressure causes heart disease and increases the risk of stroke. This is more likely to develop in people who have high blood pressure readings or are overweight. Have your blood pressure checked: Every 3-5 years if you are 32-23 years of age. Every year if you are 31 years old or older. Eat a healthy diet  Eat a diet that includes plenty of vegetables, fruits, low-fat dairy products, and lean protein. Do not eat a lot of foods that are high in solid fats, added sugars, or sodium. Get regular exercise Get regular exercise. This is one of the most important things you can do for your health. Most adults should: Try to exercise for at least 150 minutes each week. The exercise should increase your heart rate and make you sweat (moderate-intensity exercise). Try to do strengthening exercises at least twice each week. Do these in addition to the moderate-intensity exercise. Spend less time sitting. Even light physical activity can be beneficial. Other tips Work with your health care provider to achieve or maintain a healthy weight. Do not use any products that contain nicotine or tobacco. These products include cigarettes, chewing tobacco, and vaping devices, such as e-cigarettes. If you need help quitting, ask your health care provider. Know your numbers. Ask your health care provider to check your cholesterol and your blood sugar (glucose). Continue to have your blood tested as directed by your health care provider. Do I need screening for cancer? Depending on your health history and family history, you may need to have cancer screenings at different stages of your life. This may include screening for: Breast cancer. Cervical cancer. Lung cancer. Colorectal cancer. What is my risk for osteoporosis? After menopause, you may be  at increased risk for osteoporosis. Osteoporosis is a condition in which bone destruction happens more quickly than new bone creation. To help prevent osteoporosis or  the bone fractures that can happen because of osteoporosis, you may take the following actions: If you are 24-54 years old, get at least 1,000 mg of calcium and at least 600 international units (IU) of vitamin D  per day. If you are older than age 75 but younger than age 30, get at least 1,200 mg of calcium and at least 600 international units (IU) of vitamin D  per day. If you are older than age 8, get at least 1,200 mg of calcium and at least 800 international units (IU) of vitamin D  per day. Smoking and drinking excessive alcohol increase the risk of osteoporosis. Eat foods that are rich in calcium and vitamin D , and do weight-bearing exercises several times each week as directed by your health care provider. How does menopause affect my mental health? Depression may occur at any age, but it is more common as you become older. Common symptoms of depression include: Feeling depressed. Changes in sleep patterns. Changes in appetite or eating patterns. Feeling an overall lack of motivation or enjoyment of activities that you previously enjoyed. Frequent crying spells. Talk with your health care provider if you think that you are experiencing any of these symptoms. General instructions See your health care provider for regular wellness exams and vaccines. This may include: Scheduling regular health, dental, and eye exams. Getting and maintaining your vaccines. These include: Influenza vaccine. Get this vaccine each year before the flu season begins. Pneumonia vaccine. Shingles vaccine. Tetanus, diphtheria, and pertussis (Tdap) booster vaccine. Your health care provider may also recommend other immunizations. Tell your health care provider if you have ever been abused or do not feel safe at home. Summary Menopause is a normal process in which your ability to get pregnant comes to an end. This condition causes hot flashes, night sweats, decreased interest in sex, mood swings, headaches, or lack  of sleep. Treatment for this condition may include hormone replacement therapy. Take actions to keep yourself healthy, including exercising regularly, eating a healthy diet, watching your weight, and checking your blood pressure and blood sugar levels. Get screened for cancer and depression. Make sure that you are up to date with all your vaccines. This information is not intended to replace advice given to you by your health care provider. Make sure you discuss any questions you have with your health care provider. Document Revised: 04/24/2021 Document Reviewed: 04/24/2021 Elsevier Patient Education  2024 [employer].

## 2025-01-05 ENCOUNTER — Ambulatory Visit: Payer: Self-pay | Admitting: Internal Medicine

## 2025-01-05 ENCOUNTER — Ambulatory Visit: Admitting: Certified Nurse Midwife

## 2025-01-05 VITALS — BP 121/72 | HR 82 | Ht 67.0 in | Wt 183.9 lb

## 2025-01-05 DIAGNOSIS — N958 Other specified menopausal and perimenopausal disorders: Secondary | ICD-10-CM | POA: Diagnosis not present

## 2025-01-05 DIAGNOSIS — R6882 Decreased libido: Secondary | ICD-10-CM | POA: Diagnosis not present

## 2025-01-05 LAB — HEMOGLOBIN A1C
Hgb A1c MFr Bld: 5.9 % — ABNORMAL HIGH
Mean Plasma Glucose: 123 mg/dL
eAG (mmol/L): 6.8 mmol/L

## 2025-01-05 LAB — CBC
HCT: 38.9 % (ref 35.9–46.0)
Hemoglobin: 12 g/dL (ref 11.7–15.5)
MCH: 25.8 pg — ABNORMAL LOW (ref 27.0–33.0)
MCHC: 30.8 g/dL — ABNORMAL LOW (ref 31.6–35.4)
MCV: 83.7 fL (ref 81.4–101.7)
MPV: 11 fL (ref 7.5–12.5)
Platelets: 356 Thousand/uL (ref 140–400)
RBC: 4.65 Million/uL (ref 3.80–5.10)
RDW: 13.9 % (ref 11.0–15.0)
WBC: 7.2 Thousand/uL (ref 3.8–10.8)

## 2025-01-05 LAB — COMPREHENSIVE METABOLIC PANEL WITH GFR
AG Ratio: 1.2 (calc) (ref 1.0–2.5)
ALT: 8 U/L (ref 6–29)
AST: 14 U/L (ref 10–35)
Albumin: 4.1 g/dL (ref 3.6–5.1)
Alkaline phosphatase (APISO): 88 U/L (ref 37–153)
BUN: 14 mg/dL (ref 7–25)
CO2: 28 mmol/L (ref 20–32)
Calcium: 9.5 mg/dL (ref 8.6–10.4)
Chloride: 105 mmol/L (ref 98–110)
Creat: 0.83 mg/dL (ref 0.50–1.05)
Globulin: 3.5 g/dL (ref 1.9–3.7)
Glucose, Bld: 89 mg/dL (ref 65–99)
Potassium: 4.4 mmol/L (ref 3.5–5.3)
Sodium: 140 mmol/L (ref 135–146)
Total Bilirubin: 0.3 mg/dL (ref 0.2–1.2)
Total Protein: 7.6 g/dL (ref 6.1–8.1)
eGFR: 81 mL/min/1.73m2

## 2025-01-05 LAB — LIPID PANEL
Cholesterol: 177 mg/dL
HDL: 62 mg/dL
LDL Cholesterol (Calc): 102 mg/dL — ABNORMAL HIGH
Non-HDL Cholesterol (Calc): 115 mg/dL
Total CHOL/HDL Ratio: 2.9 (calc)
Triglycerides: 52 mg/dL

## 2025-01-05 MED ORDER — ESTRADIOL 0.01 % VA CREA: TOPICAL_CREAM | VAGINAL | 3 refills | Status: AC

## 2025-06-04 ENCOUNTER — Encounter

## 2025-07-09 ENCOUNTER — Ambulatory Visit: Admitting: Internal Medicine
